# Patient Record
Sex: Female | Born: 1965
Health system: Southern US, Community
[De-identification: ages and names within clinical notes are randomized; demographics above are authoritative.]

## PROBLEM LIST (undated history)

## (undated) DIAGNOSIS — T7840XA Allergy, unspecified, initial encounter: Secondary | ICD-10-CM

## (undated) DIAGNOSIS — A770 Spotted fever due to Rickettsia rickettsii: Secondary | ICD-10-CM

## (undated) DIAGNOSIS — I10 Essential (primary) hypertension: Secondary | ICD-10-CM

## (undated) DIAGNOSIS — Z8619 Personal history of other infectious and parasitic diseases: Secondary | ICD-10-CM

## (undated) DIAGNOSIS — Z8669 Personal history of other diseases of the nervous system and sense organs: Secondary | ICD-10-CM

## (undated) DIAGNOSIS — IMO0002 Reserved for concepts with insufficient information to code with codable children: Secondary | ICD-10-CM

## (undated) DIAGNOSIS — G43909 Migraine, unspecified, not intractable, without status migrainosus: Secondary | ICD-10-CM

## (undated) DIAGNOSIS — E119 Type 2 diabetes mellitus without complications: Secondary | ICD-10-CM

## (undated) DIAGNOSIS — K219 Gastro-esophageal reflux disease without esophagitis: Secondary | ICD-10-CM

## (undated) HISTORY — DX: Migraine, unspecified, not intractable, without status migrainosus: G43.909

## (undated) HISTORY — DX: Personal history of other diseases of the nervous system and sense organs: Z86.69

## (undated) HISTORY — DX: Gastro-esophageal reflux disease without esophagitis: K21.9

## (undated) HISTORY — DX: Personal history of other infectious and parasitic diseases: Z86.19

## (undated) HISTORY — DX: Reserved for concepts with insufficient information to code with codable children: IMO0002

## (undated) HISTORY — DX: Spotted fever due to Rickettsia rickettsii: A77.0

## (undated) HISTORY — DX: Allergy, unspecified, initial encounter: T78.40XA

## (undated) HISTORY — DX: Essential (primary) hypertension: I10

---
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
## 2002-11-01 DIAGNOSIS — A770 Spotted fever due to Rickettsia rickettsii: Secondary | ICD-10-CM

## 2002-11-01 HISTORY — DX: Spotted fever due to Rickettsia rickettsii: A77.0

## 2004-09-10 ENCOUNTER — Other Ambulatory Visit: Payer: Self-pay

## 2004-09-10 ENCOUNTER — Emergency Department: Payer: Self-pay | Admitting: Emergency Medicine

## 2005-12-21 ENCOUNTER — Ambulatory Visit: Payer: Self-pay | Admitting: Family Medicine

## 2006-01-31 ENCOUNTER — Ambulatory Visit: Payer: Self-pay | Admitting: Gastroenterology

## 2008-11-01 HISTORY — PX: OTHER SURGICAL HISTORY: SHX169

## 2011-05-07 LAB — HM PAP SMEAR: HM Pap smear: NORMAL

## 2014-04-19 ENCOUNTER — Ambulatory Visit (INDEPENDENT_AMBULATORY_CARE_PROVIDER_SITE_OTHER): Payer: BC Managed Care – PPO | Admitting: Adult Health

## 2014-04-19 ENCOUNTER — Encounter: Payer: Self-pay | Admitting: Adult Health

## 2014-04-19 ENCOUNTER — Encounter (INDEPENDENT_AMBULATORY_CARE_PROVIDER_SITE_OTHER): Payer: Self-pay

## 2014-04-19 VITALS — BP 151/97 | HR 83 | Temp 98.6°F | Resp 14 | Ht 63.75 in | Wt 224.5 lb

## 2014-04-19 DIAGNOSIS — Z1239 Encounter for other screening for malignant neoplasm of breast: Secondary | ICD-10-CM

## 2014-04-19 DIAGNOSIS — K219 Gastro-esophageal reflux disease without esophagitis: Secondary | ICD-10-CM

## 2014-04-19 DIAGNOSIS — Z23 Encounter for immunization: Secondary | ICD-10-CM

## 2014-04-19 DIAGNOSIS — G43909 Migraine, unspecified, not intractable, without status migrainosus: Secondary | ICD-10-CM | POA: Insufficient documentation

## 2014-04-19 DIAGNOSIS — I10 Essential (primary) hypertension: Secondary | ICD-10-CM | POA: Insufficient documentation

## 2014-04-19 MED ORDER — LISINOPRIL 10 MG PO TABS: 10.0000 mg | ORAL_TABLET | Freq: Every day | ORAL | Status: DC

## 2014-04-19 MED ORDER — SUMATRIPTAN SUCCINATE 50 MG PO TABS: ORAL_TABLET | ORAL | Status: DC

## 2014-04-19 NOTE — Progress Notes (Signed)
Subjective:    Patient ID: Kathryn Henderson, female    DOB: 02/02/1966, 48 y.o.   MRN: [id number]  HPI Pleasant 48 yo caucasian female presents today to establish care. [facility], but had a change in insurance. Last physical and labs were 3 years ago. Has migraine headaches 3-4 times per year. Has been on preventative medication in the past. Thinks it was topamax but not certain. Does not feel she needs prevention since only having them 3-4 times yearly. GERD is well controlled with omeprazole. She has had EGD in the past with findings of ulcer; however, this has resolved.  Health Maintenance:  Last mammogram - 3 years ago  PAP - 3 years ago - reports normal  Vaccinations:  Tetanus - indicates within the last 10 years, but does not recall date.     Past Medical History  Diagnosis Date  . GERD (gastroesophageal reflux disease)   . Ulcer   . Allergy   . History of chicken pox   . Migraine   . [facility]([place] spotted fever) 2004     Past Surgical History  Procedure Laterality Date  . Cyst removal hand Left 04/1977  . Cryo ablasion  2010     Family History  Problem Relation Age of Onset  . Arthritis Mother   . Hypertension Mother   . Arthritis Father   . Cancer Father     lung  . Hyperlipidemia Father   . Heart disease Father   . Hypertension Father   . Hypertension Maternal Grandmother   . Diabetes Maternal Grandmother   . Cancer Paternal Grandmother     breast  . Diabetes Paternal Grandmother    History   Social History  . Marital Status: Married    Spouse Name: N/A    Number of Children: 2  . Years of Education: 16   Occupational History  . Substitute Teacher    Social History Main Topics  . Smoking status: Never Smoker   . Smokeless tobacco: Not on file  . Alcohol Use: Yes     Comment: occasional  . Drug Use: No  . Sexual Activity: Not on file   Other Topics Concern  . Not on file   Social History  Narrative   Hobbies include any outdoor activity.      Review of Systems  Constitutional: Negative.   HENT: Negative.   Eyes: Negative.   Respiratory: Negative.   Cardiovascular: Negative.   Gastrointestinal: Negative.   Endocrine: Negative.   Genitourinary: Negative.   Musculoskeletal: Negative.   Skin: Negative.   Allergic/Immunologic: Negative.   Neurological: Negative.   Hematological: Negative.   Psychiatric/Behavioral: Negative.        Objective:  BP 151/97  Pulse 83  Temp(Src) 98.6 F (37 C) (Oral)  Resp 14  Ht 5' 3.75" (1.619 m)  Wt 224 lb 8 oz (101.833 kg)  BMI 38.85 kg/m2  SpO2 97%  LMP 03/05/2014   Physical Exam  Nursing note and vitals reviewed. Constitutional: She is oriented to person, place, and time. No distress.  HENT:  Head: Normocephalic.  Eyes: Conjunctivae and EOM are normal. Pupils are equal, round, and reactive to light.  Neck: Normal range of motion. Neck supple.  Cardiovascular: Normal rate, regular rhythm, normal heart sounds and intact distal pulses.   Blood pressure significantly elevated in clinic today  Pulmonary/Chest: Effort normal and breath sounds normal.  Abdominal: Soft. Bowel sounds are normal.  Musculoskeletal: Normal range of  motion.  Neurological: She is alert and oriented to person, place, and time. She has normal reflexes.  Skin: Skin is warm and dry.  Psychiatric: She has a normal mood and affect. Her behavior is normal. Judgment and thought content normal.      Assessment & Plan:   1. Essential hypertension Start lisinopril 10 mg daily. Watch dietary sodium. Monitor b/p daily and return in 2 weeks for follow up and labs  2. Gastroesophageal reflux disease without esophagitis Controlled well with omeprazole  3. Migraine, unspecified, without mention of intractable migraine without mention of status migrainosus Start imitrex 50 mg at the onset of migraine. Discussed administration of medication as well as SE.  Follow  4. Screening for breast cancer Ordered mammogram - MM DIGITAL SCREENING BILATERAL; Future  5. Need for Tdap vaccination Administered Tdap in clinic today - Tdap vaccine greater than or equal to 7yo IM

## 2014-04-19 NOTE — Progress Notes (Signed)
Pre visit review using our clinic review tool, if applicable. No additional management support is needed unless otherwise documented below in the visit note. 

## 2014-04-19 NOTE — Patient Instructions (Addendum)
   Please see Kathryn JonesCarolyn prior to leaving the office to schedule you Mammogram  Schedule your complete physical at your earliest convenience. You will need to be fasting. Water is ok.  I sent in a prescription to your pharmacy for imitrex. Take this at the onset of a migraine headache and may repeat in 2 hours if not resolved.  STart lisinopril 10 mg daily. Monitor your blood pressure daily for 2 weeks and bring the readings with you to your next visit in 2 week.

## 2014-04-20 ENCOUNTER — Telehealth: Payer: Self-pay | Admitting: Adult Health

## 2014-04-20 NOTE — Telephone Encounter (Signed)
Relevant patient education assigned to patient using Emmi. ° °

## 2014-04-25 ENCOUNTER — Telehealth: Payer: Self-pay | Admitting: Adult Health

## 2014-04-25 NOTE — Telephone Encounter (Signed)
Pt left vm checking status of mammogram.  States she thought she was seen Friday, 6/19, and thought she would receive a call the same day.

## 2014-05-02 ENCOUNTER — Encounter: Payer: Self-pay | Admitting: Adult Health

## 2014-05-02 ENCOUNTER — Ambulatory Visit (INDEPENDENT_AMBULATORY_CARE_PROVIDER_SITE_OTHER): Payer: BC Managed Care – PPO | Admitting: Adult Health

## 2014-05-02 VITALS — BP 148/82 | HR 86 | Temp 98.2°F | Resp 14 | Wt 224.5 lb

## 2014-05-02 DIAGNOSIS — I10 Essential (primary) hypertension: Secondary | ICD-10-CM

## 2014-05-02 LAB — BASIC METABOLIC PANEL
BUN: 13 mg/dL (ref 6–23)
CO2: 28 mEq/L (ref 19–32)
Calcium: 9.5 mg/dL (ref 8.4–10.5)
Chloride: 102 mEq/L (ref 96–112)
Creatinine, Ser: 0.6 mg/dL (ref 0.4–1.2)
GFR: 120.42 mL/min (ref >60.00)
Glucose, Bld: 208 mg/dL — ABNORMAL HIGH (ref 70–99)
Potassium: 4.6 mEq/L (ref 3.5–5.1)
Sodium: 136 mEq/L (ref 135–145)

## 2014-05-02 MED ORDER — HYDROCHLOROTHIAZIDE 25 MG PO TABS: 25.0000 mg | ORAL_TABLET | Freq: Every day | ORAL | Status: DC

## 2014-05-02 NOTE — Progress Notes (Signed)
Patient ID: Kathryn Henderson, female   DOB: [birth year], 48 y.o.   MRN: [id number]   Subjective:    Patient ID: Kathryn Henderson, female    DOB: [birth year], 48 y.o.   MRN: [id number]  HPI  Pt presents for f/u hypertension. She was recently started on lisinopril 10 mg. Her blood pressure readings at home have been as follows:  6/19 - 177/94   6/24 - 148/91 6/20 - 156/94   6/25 - 170/89 6/21 - 148/91   6/26 - 148/96 6/22 - 150/95   6/27 - 134/90 6/23 - 163/92   6/29 - 148/79  Pt is overall feeling well. No chest pain, shortness of breath. She is voiding without any incident.   Past Medical History  Diagnosis Date  . GERD (gastroesophageal reflux disease)   . Ulcer   . Allergy   . History of chicken pox   . Migraine   . [facility]([place] spotted fever) 2004    Current Outpatient Prescriptions on File Prior to Visit  Medication Sig Dispense Refill  . aspirin-acetaminophen-caffeine (EXCEDRIN MIGRAINE) 250-250-65 MG per tablet Take by mouth every 6 (six) hours as needed for headache.      . cetirizine (ZYRTEC) 10 MG tablet Take 10 mg by mouth daily.      . diphenhydrAMINE (BENADRYL) 25 MG tablet Take 25 mg by mouth every 6 (six) hours as needed.      . fluticasone (FLONASE) 50 MCG/ACT nasal spray Place 1 spray into both nostrils daily.      Marland Kitchen. lisinopril (PRINIVIL,ZESTRIL) 10 MG tablet Take 1 tablet (10 mg total) by mouth daily.  30 tablet  3  . loperamide (IMODIUM A-D) 2 MG tablet Take 2 mg by mouth 4 (four) times daily as needed for diarrhea or loose stools.      . Multiple Vitamin (MULTIVITAMIN) tablet Take 1 tablet by mouth daily.      . naproxen sodium (ANAPROX) 220 MG tablet Take 220 mg by mouth as needed.      Marland Kitchen. omeprazole (PRILOSEC) 20 MG capsule Take 20 mg by mouth daily.      . SUMAtriptan (IMITREX) 50 MG tablet May repeat in 2 hours if headache persists or recurs.  10 tablet  0   No current facility-administered medications on file prior to visit.    Review of Systems    Constitutional: Negative.   HENT: Negative.   Eyes: Negative.   Respiratory: Negative.   Cardiovascular: Negative.   Gastrointestinal: Negative.   Endocrine: Negative.   Genitourinary: Negative.   Musculoskeletal: Negative.   Skin: Negative.   Allergic/Immunologic: Negative.   Neurological: Negative.   Hematological: Negative.   Psychiatric/Behavioral: Negative.        Objective:  BP 148/82  Pulse 86  Temp(Src) 98.2 F (36.8 C) (Oral)  Resp 14  Wt 224 lb 8 oz (101.833 kg)  SpO2 97%  LMP 03/05/2014   Physical Exam  Constitutional: She is oriented to person, place, and time. No distress.  HENT:  Head: Normocephalic and atraumatic.  Eyes: Conjunctivae and EOM are normal.  Neck: Normal range of motion. Neck supple.  Cardiovascular: Normal rate, regular rhythm, normal heart sounds and intact distal pulses.  Exam reveals no gallop and no friction rub.   No murmur heard. Pulmonary/Chest: Effort normal and breath sounds normal. No respiratory distress. She has no wheezes. She has no rales.  Musculoskeletal: Normal range of motion.  Neurological: She is alert and oriented to person, place, and time. She has  normal reflexes. Coordination normal.  Skin: Skin is warm and dry.  Psychiatric: She has a normal mood and affect. Her behavior is normal. Judgment and thought content normal.      Assessment & Plan:   1. Essential hypertension Blood pressure not well controlled on lisinopril 10 mg. Add HCTZ 25 mg. Follow up in 1 month. Pt instructed to bring in her blood pressure machine and her readings. Check bmet today. - Basic metabolic panel

## 2014-05-02 NOTE — Progress Notes (Signed)
Pre visit review using our clinic review tool, if applicable. No additional management support is needed unless otherwise documented below in the visit note. 

## 2014-05-02 NOTE — Patient Instructions (Signed)
  Start HCTZ 25 mg in the morning. Continue the lisinopril as well.  I want you to check your blood pressure as you did previously and return in 1 month for follow up.  Please bring your blood pressure machine with you on your next visit so that we can compare.

## 2014-05-04 ENCOUNTER — Other Ambulatory Visit: Payer: Self-pay | Admitting: Adult Health

## 2014-05-04 DIAGNOSIS — R7309 Other abnormal glucose: Secondary | ICD-10-CM

## 2014-05-06 ENCOUNTER — Ambulatory Visit (INDEPENDENT_AMBULATORY_CARE_PROVIDER_SITE_OTHER): Payer: BC Managed Care – PPO | Admitting: Adult Health

## 2014-05-06 ENCOUNTER — Encounter: Payer: Self-pay | Admitting: Adult Health

## 2014-05-06 VITALS — BP 114/78 | HR 101 | Temp 98.5°F | Resp 14 | Ht 63.75 in | Wt 221.8 lb

## 2014-05-06 DIAGNOSIS — Z0000 Encounter for general adult medical examination without abnormal findings: Secondary | ICD-10-CM

## 2014-05-06 DIAGNOSIS — R739 Hyperglycemia, unspecified: Secondary | ICD-10-CM

## 2014-05-06 DIAGNOSIS — R7309 Other abnormal glucose: Secondary | ICD-10-CM

## 2014-05-06 DIAGNOSIS — Z1239 Encounter for other screening for malignant neoplasm of breast: Secondary | ICD-10-CM

## 2014-05-06 DIAGNOSIS — E785 Hyperlipidemia, unspecified: Secondary | ICD-10-CM

## 2014-05-06 NOTE — Patient Instructions (Signed)
  You had your annual physical today.  Mammogram scheduled early August.  PAP will be due 2017 or sooner if necessary.  Please return for fasting labs. You can schedule a lab appointment prior to leaving the office today.  We will notify you of the results once they are available and I have had a chance to review them.  Please feel free to call with any questions or concerns.

## 2014-05-06 NOTE — Progress Notes (Signed)
Pre visit review using our clinic review tool, if applicable. No additional management support is needed unless otherwise documented below in the visit note. 

## 2014-05-06 NOTE — Progress Notes (Signed)
Patient ID: Kathryn SaxonSuzanne Henderson, female   DOB: [birth year], 48 y.o.   MRN: [id number]   Subjective:    Patient ID: Kathryn SaxonSuzanne Henderson, female    DOB: [birth year], 48 y.o.   MRN: [id number]  HPI Pt is a pleasant 48 y/o female who presents to clinic for her annual physical exam. She is not fasting. She will return to have fasting labs as part of this routine general exam. Overall, she is feeling well.  Health Maintenance:  PAP - 2012. Next due 2017 Mammogram - appt scheduled in early August   Past Medical History  Diagnosis Date  . GERD (gastroesophageal reflux disease)   . Ulcer   . Allergy   . History of chicken pox   . Migraine   . [facility]([place] fever) 2004     Past Surgical History  Procedure Laterality Date  . Cyst removal hand Left 04/1977  . Cryo ablasion  2010     Family History  Problem Relation Age of Onset  . Arthritis Mother   . Hypertension Mother   . Arthritis Father   . Cancer Father     lung  . Hyperlipidemia Father   . Heart disease Father   . Hypertension Father   . Hypertension Maternal Grandmother   . Diabetes Maternal Grandmother   . Cancer Paternal Grandmother     breast  . Diabetes Paternal Grandmother      History   Social History  . Marital Status: Married    Spouse Name: N/A    Number of Children: 2  . Years of Education: 16   Occupational History  . Substitute Teacher    Social History Main Topics  . Smoking status: Never Smoker   . Smokeless tobacco: Not on file  . Alcohol Use: Yes     Comment: occasional  . Drug Use: No  . Sexual Activity: Not on file   Other Topics Concern  . Not on file   Social History Narrative   Hobbies include any outdoor activity.      Current Outpatient Prescriptions on File Prior to Visit  Medication Sig Dispense Refill  . aspirin-acetaminophen-caffeine (EXCEDRIN MIGRAINE) 250-250-65 MG per tablet Take by mouth every 6 (six) hours as needed for headache.      . cetirizine (ZYRTEC) 10  MG tablet Take 10 mg by mouth daily.      . diphenhydrAMINE (BENADRYL) 25 MG tablet Take 25 mg by mouth every 6 (six) hours as needed.      . fluticasone (FLONASE) 50 MCG/ACT nasal spray Place 1 spray into both nostrils daily.      . hydrochlorothiazide (HYDRODIURIL) 25 MG tablet Take 1 tablet (25 mg total) by mouth daily.  30 tablet  3  . lisinopril (PRINIVIL,ZESTRIL) 10 MG tablet Take 1 tablet (10 mg total) by mouth daily.  30 tablet  3  . loperamide (IMODIUM A-D) 2 MG tablet Take 2 mg by mouth 4 (four) times daily as needed for diarrhea or loose stools.      . Multiple Vitamin (MULTIVITAMIN) tablet Take 1 tablet by mouth daily.      . naproxen sodium (ANAPROX) 220 MG tablet Take 220 mg by mouth as needed.      Marland Kitchen. omeprazole (PRILOSEC) 20 MG capsule Take 20 mg by mouth daily.      . SUMAtriptan (IMITREX) 50 MG tablet May repeat in 2 hours if headache persists or recurs.  10 tablet  0   No current facility-administered medications on  file prior to visit.     Review of Systems  Constitutional: Negative.   HENT: Negative.   Eyes: Negative.   Respiratory: Negative.   Cardiovascular: Negative.   Gastrointestinal: Negative.   Endocrine: Negative.   Genitourinary: Negative.   Musculoskeletal: Negative.   Skin: Negative.   Allergic/Immunologic: Negative.   Neurological: Negative.   Hematological: Negative.   Psychiatric/Behavioral: Negative.        Objective:  BP 114/78  Pulse 101  Temp(Src) 98.5 F (36.9 C) (Oral)  Resp 14  Ht 5' 3.75" (1.619 m)  Wt 221 lb 12 oz (100.585 kg)  BMI 38.37 kg/m2  SpO2 96%  LMP 03/05/2014   Physical Exam  Constitutional: She is oriented to person, place, and time. No distress.  Overweight, pleasant 48 y/o female  HENT:  Head: Normocephalic and atraumatic.  Left Ear: External ear normal.  Nose: Nose normal.  Mouth/Throat: Oropharynx is clear and moist.  Right ear with cerumen buildup  Eyes: Conjunctivae and EOM are normal. Pupils are equal,  round, and reactive to light.  Neck: Normal range of motion. Neck supple. No tracheal deviation present. No thyromegaly present.  Cardiovascular: Normal rate, regular rhythm, normal heart sounds and intact distal pulses.  Exam reveals no gallop and no friction rub.   No murmur heard. Pulmonary/Chest: Effort normal and breath sounds normal. No respiratory distress. She has no wheezes. She has no rales.  Abdominal: Soft. Bowel sounds are normal. She exhibits no distension and no mass. There is no tenderness. There is no rebound and no guarding.  Musculoskeletal: Normal range of motion. She exhibits no edema and no tenderness.  Lymphadenopathy:    She has no cervical adenopathy.  Neurological: She is alert and oriented to person, place, and time. She has normal reflexes. No cranial nerve deficit. Coordination normal.  Skin: Skin is warm and dry.  Psychiatric: She has a normal mood and affect. Her behavior is normal. Judgment and thought content normal.       Assessment & Plan:   1. Routine general medical examination at a health care facility Normal physical exam including breast exam. PAP deferred and due in 2017. Labs will be ordered. Pt will return for fasting labs.  2. Screening for breast cancer She has appointment for mammogram in early August.   3. Blood glucose elevated Recent blood glucose elevated. Check A1c. - Hemoglobin A1c; Future  4. HLD (hyperlipidemia) Screening for HLD. Check lipids. Follow. - Lipid panel; Future

## 2014-05-07 ENCOUNTER — Other Ambulatory Visit: Payer: BC Managed Care – PPO

## 2014-05-14 ENCOUNTER — Other Ambulatory Visit (INDEPENDENT_AMBULATORY_CARE_PROVIDER_SITE_OTHER): Payer: BC Managed Care – PPO

## 2014-05-14 DIAGNOSIS — E785 Hyperlipidemia, unspecified: Secondary | ICD-10-CM

## 2014-05-14 DIAGNOSIS — R739 Hyperglycemia, unspecified: Secondary | ICD-10-CM

## 2014-05-14 DIAGNOSIS — R7309 Other abnormal glucose: Secondary | ICD-10-CM

## 2014-05-14 LAB — LIPID PANEL
Cholesterol: 187 mg/dL (ref 0–200)
HDL: 39.4 mg/dL (ref >39.00)
LDL Cholesterol: 117 mg/dL — ABNORMAL HIGH (ref 0–99)
NonHDL: 147.6
Total CHOL/HDL Ratio: 5
Triglycerides: 154 mg/dL — ABNORMAL HIGH (ref 0.0–149.0)
VLDL: 30.8 mg/dL (ref 0.0–40.0)

## 2014-05-14 LAB — HEMOGLOBIN A1C: Hgb A1c MFr Bld: 7.4 % — ABNORMAL HIGH (ref 4.6–6.5)

## 2014-05-15 ENCOUNTER — Encounter: Payer: Self-pay | Admitting: *Deleted

## 2014-05-15 LAB — GLUCOSE, FASTING: Glucose, Fasting: 156 mg/dL — ABNORMAL HIGH (ref 70–99)

## 2014-05-18 ENCOUNTER — Other Ambulatory Visit: Payer: Self-pay | Admitting: Adult Health

## 2014-05-18 MED ORDER — METFORMIN HCL 500 MG PO TABS: 500.0000 mg | ORAL_TABLET | Freq: Two times a day (BID) | ORAL | Status: DC

## 2014-05-22 ENCOUNTER — Telehealth: Payer: Self-pay

## 2014-05-22 NOTE — Telephone Encounter (Signed)
2nd attempt to reach patient regarding her recent mammogram results. No answer, left a voicemail asking patient to call the office. Left my callback number on the voicemail as well.

## 2014-05-23 ENCOUNTER — Telehealth: Payer: Self-pay

## 2014-05-23 NOTE — Telephone Encounter (Signed)
Per Raquel: Called patient to confirm she had a follow up appointment scheduled for addition films to done for mammogram. Patient stated she has appt today at 2:30pm.

## 2014-05-30 ENCOUNTER — Ambulatory Visit (INDEPENDENT_AMBULATORY_CARE_PROVIDER_SITE_OTHER): Payer: BC Managed Care – PPO | Admitting: Adult Health

## 2014-05-30 ENCOUNTER — Encounter: Payer: Self-pay | Admitting: Adult Health

## 2014-05-30 VITALS — BP 121/83 | HR 93 | Temp 98.6°F | Resp 14 | Wt 219.8 lb

## 2014-05-30 DIAGNOSIS — I10 Essential (primary) hypertension: Secondary | ICD-10-CM

## 2014-05-30 LAB — BASIC METABOLIC PANEL
BUN: 13 mg/dL (ref 6–23)
CO2: 29 mEq/L (ref 19–32)
Calcium: 9.7 mg/dL (ref 8.4–10.5)
Chloride: 101 mEq/L (ref 96–112)
Creatinine, Ser: 0.6 mg/dL (ref 0.4–1.2)
GFR: 109.25 mL/min (ref >60.00)
Glucose, Bld: 148 mg/dL — ABNORMAL HIGH (ref 70–99)
Potassium: 3.8 mEq/L (ref 3.5–5.1)
Sodium: 138 mEq/L (ref 135–145)

## 2014-05-30 NOTE — Progress Notes (Signed)
Pre visit review using our clinic review tool, if applicable. No additional management support is needed unless otherwise documented below in the visit note. 

## 2014-05-30 NOTE — Progress Notes (Signed)
Patient ID: Kathryn Henderson, female   DOB: February 04, 1966, 47 y.o.   MRN: [id number]   Subjective:    Patient ID: Kathryn Henderson, female    DOB: 1966/04/18, 48 y.o.   MRN: [id number]  HPI  Kathryn Henderson is a pleasant 48 y/o female who presents to clinic for HTN f/u. She is taking lisinopril and HCTZ. Doing well with both medications. She has been checking her blood pressure and brings several readings with her:  130/81  113/79 134/77  124/77 135/76  128/79 129/70  116/77    Past Medical History  Diagnosis Date  . GERD (gastroesophageal reflux disease)   . Ulcer   . Allergy   . History of chicken pox   . Migraine   . [facility] spotted fever) 2004    Current Outpatient Prescriptions on File Prior to Visit  Medication Sig Dispense Refill  . aspirin-acetaminophen-caffeine (EXCEDRIN MIGRAINE) 250-250-65 MG per tablet Take by mouth every 6 (six) hours as needed for headache.      . cetirizine (ZYRTEC) 10 MG tablet Take 10 mg by mouth daily.      . diphenhydrAMINE (BENADRYL) 25 MG tablet Take 25 mg by mouth every 6 (six) hours as needed.      . fluticasone (FLONASE) 50 MCG/ACT nasal spray Place 1 spray into both nostrils daily.      . hydrochlorothiazide (HYDRODIURIL) 25 MG tablet Take 1 tablet (25 mg total) by mouth daily.  30 tablet  3  . lisinopril (PRINIVIL,ZESTRIL) 10 MG tablet Take 1 tablet (10 mg total) by mouth daily.  30 tablet  3  . loperamide (IMODIUM A-D) 2 MG tablet Take 2 mg by mouth 4 (four) times daily as needed for diarrhea or loose stools.      . metFORMIN (GLUCOPHAGE) 500 MG tablet Take 1 tablet (500 mg total) by mouth 2 (two) times daily with a meal.  60 tablet  3  . Multiple Vitamin (MULTIVITAMIN) tablet Take 1 tablet by mouth daily.      . naproxen sodium (ANAPROX) 220 MG tablet Take 220 mg by mouth as needed.      Marland Kitchen omeprazole (PRILOSEC) 20 MG capsule Take 20 mg by mouth daily.      . SUMAtriptan (IMITREX) 50 MG tablet May repeat in 2 hours if headache persists or  recurs.  10 tablet  0   No current facility-administered medications on file prior to visit.     Review of Systems  Constitutional: Negative.   HENT: Negative.   Eyes: Negative.   Respiratory: Negative.  Negative for cough.   Cardiovascular: Negative.  Negative for chest pain, palpitations and leg swelling.  Gastrointestinal: Negative.   Endocrine: Negative.   Genitourinary: Negative.   Musculoskeletal: Negative.   Skin: Negative.   Allergic/Immunologic: Negative.   Neurological: Negative.   Hematological: Negative.   Psychiatric/Behavioral: Negative.        Objective:  There were no vitals taken for this visit.   Physical Exam  Constitutional: She is oriented to person, place, and time. No distress.  HENT:  Head: Normocephalic and atraumatic.  Eyes: Conjunctivae and EOM are normal.  Neck: Normal range of motion. Neck supple.  Cardiovascular: Normal rate, regular rhythm, normal heart sounds and intact distal pulses.  Exam reveals no gallop and no friction rub.   No murmur heard. Pulmonary/Chest: Effort normal and breath sounds normal. No respiratory distress. She has no wheezes. She has no rales.  Musculoskeletal: Normal range of motion.  Neurological: She is  alert and oriented to person, place, and time. She has normal reflexes. Coordination normal.  Skin: Skin is warm and dry.  Psychiatric: She has a normal mood and affect. Her behavior is normal. Judgment and thought content normal.      Assessment & Plan:   1. Essential hypertension Well controlled on current meds. Check labs. Follow. - Basic metabolic panel

## 2014-05-31 ENCOUNTER — Encounter: Payer: Self-pay | Admitting: *Deleted

## 2014-06-07 ENCOUNTER — Encounter: Payer: Self-pay | Admitting: Adult Health

## 2014-06-10 DIAGNOSIS — R928 Other abnormal and inconclusive findings on diagnostic imaging of breast: Secondary | ICD-10-CM | POA: Insufficient documentation

## 2014-06-10 HISTORY — PX: BREAST BIOPSY: SHX20

## 2014-06-25 ENCOUNTER — Encounter: Payer: Self-pay | Admitting: Adult Health

## 2014-07-15 ENCOUNTER — Other Ambulatory Visit: Payer: Self-pay | Admitting: Adult Health

## 2014-07-29 ENCOUNTER — Other Ambulatory Visit: Payer: Self-pay | Admitting: *Deleted

## 2014-07-29 MED ORDER — HYDROCHLOROTHIAZIDE 25 MG PO TABS: 25.0000 mg | ORAL_TABLET | Freq: Every day | ORAL | Status: DC

## 2014-08-30 ENCOUNTER — Ambulatory Visit: Payer: BC Managed Care – PPO | Admitting: Internal Medicine

## 2014-09-09 ENCOUNTER — Telehealth: Payer: Self-pay

## 2014-09-09 MED ORDER — METFORMIN HCL 500 MG PO TABS: 500.0000 mg | ORAL_TABLET | Freq: Two times a day (BID) | ORAL | Status: DC

## 2014-09-09 NOTE — Telephone Encounter (Signed)
Paper Rx request.

## 2014-09-24 ENCOUNTER — Encounter: Payer: Self-pay | Admitting: Internal Medicine

## 2014-09-24 ENCOUNTER — Ambulatory Visit (INDEPENDENT_AMBULATORY_CARE_PROVIDER_SITE_OTHER): Payer: BC Managed Care – PPO | Admitting: Internal Medicine

## 2014-09-24 ENCOUNTER — Encounter (INDEPENDENT_AMBULATORY_CARE_PROVIDER_SITE_OTHER): Payer: Self-pay

## 2014-09-24 VITALS — BP 126/78 | HR 83 | Temp 98.5°F | Resp 16 | Ht 63.75 in | Wt 219.8 lb

## 2014-09-24 DIAGNOSIS — I10 Essential (primary) hypertension: Secondary | ICD-10-CM

## 2014-09-24 DIAGNOSIS — Z23 Encounter for immunization: Secondary | ICD-10-CM

## 2014-09-24 DIAGNOSIS — E119 Type 2 diabetes mellitus without complications: Secondary | ICD-10-CM

## 2014-09-24 NOTE — Progress Notes (Signed)
Patient ID: Kathryn BridgemanSuzanne J Henderson, female   DOB: [birth year], 48 y.o.   MRN: [id number]  Patient Active Problem List   Diagnosis Date Noted  . Type II diabetes mellitus 09/26/2014  . Screening for breast cancer 04/19/2014  . Need for Tdap vaccination 04/19/2014  . Essential hypertension 04/19/2014  . Migraine, unspecified, without mention of intractable migraine without mention of status migrainosus 04/19/2014  . Gastroesophageal reflux disease without esophagitis 04/19/2014    Subjective:  CC:   Chief Complaint  Patient presents with  . Follow-up    blood sugar diagnosed with hyperglycemia and wonders if she is diabetic    HPI:   Kathryn BridgemanSuzanne J Henderson is a 48 y.o. female who presents for   3 month follow up  on hyperglycemia , treated with metformin which was started in July by RR for a1c 7.4 and fasting glucose 158.  Patient was not aware that she was diagnosed with DM at that time.and has not been checking her blood sugars, but has been taking the metformin and reducing the refined sugar in her diet.  She has a history of gestational diabetes ,  Which occurred over 15 years ago  She denies any changes in vision and denies having polyuria, polydipsia and parasthesias.       Past Medical History  Diagnosis Date  . GERD (gastroesophageal reflux disease)   . Ulcer   . Allergy   . History of chicken pox   . Migraine   . RMSF [facility]([place] spotted fever) 2004    Past Surgical History  Procedure Laterality Date  . Cyst removal hand Left 04/1977  . Cryo ablasion  2010       The following portions of the patient's history were reviewed and updated as appropriate: Allergies, current medications, and problem list.    Review of Systems:   Patient denies headache, fevers, malaise, unintentional weight loss, skin rash, eye pain, sinus congestion and sinus pain, sore throat, dysphagia,  hemoptysis , cough, dyspnea, wheezing, chest pain, palpitations, orthopnea, edema, abdominal pain,  nausea, melena, diarrhea, constipation, flank pain, dysuria, hematuria, urinary  Frequency, nocturia, numbness, tingling, seizures,  Focal weakness, Loss of consciousness,  Tremor, insomnia, depression, anxiety, and suicidal ideation.     History   Social History  . Marital Status: Married    Spouse Name: N/A    Number of Children: 2  . Years of Education: 16   Occupational History  . Substitute Teacher    Social History Main Topics  . Smoking status: Never Smoker   . Smokeless tobacco: Not on file  . Alcohol Use: Yes     Comment: occasional  . Drug Use: No  . Sexual Activity: Not on file   Other Topics Concern  . Not on file   Social History Narrative   Hobbies include any outdoor activity.     Objective:  Filed Vitals:   09/24/14 1413  BP: 126/78  Pulse: 83  Temp: 98.5 F (36.9 C)  Resp: 16     General appearance: alert, cooperative and appears stated age Ears: normal TM's and external ear canals both ears Throat: lips, mucosa, and tongue normal; teeth and gums normal Neck: no adenopathy, no carotid bruit, supple, symmetrical, trachea midline and thyroid not enlarged, symmetric, no tenderness/mass/nodules Back: symmetric, no curvature. ROM normal. No CVA tenderness. Lungs: clear to auscultation bilaterally Heart: regular rate and rhythm, S1, S2 normal, no murmur, click, rub or gallop Abdomen: soft, non-tender; bowel sounds normal; no masses,  no  organomegaly Pulses: 2+ and symmetric Skin: Skin color, texture, turgor normal. No rashes or lesions Lymph nodes: Cervical, supraclavicular, and axillary nodes normal.  Assessment and Plan:  Type II diabetes mellitus Diagnosed with fasting glucose of 158 and a1c of 7.4 in July.  She has been taking metformin since then  And repeat a1c is  Now < 7.0.  Foot exam is normal.  Education initiated and low glycemic index diet and glucometer given.  [facility].  Diabetic  Eye exam advised.    Lab Results  Component Value Date   HGBA1C 6.8* 09/24/2014   Lab Results  Component Value Date   MICROALBUR 0.7 09/24/2014   Lab Results  Component Value Date   CHOL 187 05/14/2014   HDL 39.40 05/14/2014   LDLCALC 117* 05/14/2014   TRIG 154.0* 05/14/2014   CHOLHDL 5 05/14/2014     Essential hypertension Well controlled on current regimen. Renal function stable, no changes today.  Lab Results  Component Value Date   CREATININE 0.6 09/24/2014   Lab Results  Component Value Date   NA 136 09/24/2014   K 3.8 09/24/2014   CL 99 09/24/2014   CO2 27 09/24/2014      Updated Medication List Outpatient Encounter Prescriptions as of 09/24/2014  Medication Sig  . aspirin-acetaminophen-caffeine (EXCEDRIN MIGRAINE) 250-250-65 MG per tablet Take by mouth every 6 (six) hours as needed for headache.  . cetirizine (ZYRTEC) 10 MG tablet Take 10 mg by mouth daily.  . diphenhydrAMINE (BENADRYL) 25 MG tablet Take 25 mg by mouth every 6 (six) hours as needed.  . fluticasone (FLONASE) 50 MCG/ACT nasal spray Place 1 spray into both nostrils daily.  . hydrochlorothiazide (HYDRODIURIL) 25 MG tablet Take 1 tablet (25 mg total) by mouth daily.  Marland Kitchen. lisinopril (PRINIVIL,ZESTRIL) 10 MG tablet TAKE ONE TABLET BY MOUTH EVERY DAY  . loperamide (IMODIUM A-D) 2 MG tablet Take 2 mg by mouth 4 (four) times daily as needed for diarrhea or loose stools.  . metFORMIN (GLUCOPHAGE) 500 MG tablet Take 1 tablet (500 mg total) by mouth 2 (two) times daily with a meal.  . Multiple Vitamin (MULTIVITAMIN) tablet Take 1 tablet by mouth daily.  . naproxen sodium (ANAPROX) 220 MG tablet Take 220 mg by mouth as needed.  Marland Kitchen. omeprazole (PRILOSEC) 20 MG capsule Take 20 mg by mouth daily.  . SUMAtriptan (IMITREX) 50 MG tablet May repeat in 2 hours if headache persists or recurs.     Orders Placed This Encounter  Procedures  . Pneumococcal polysaccharide vaccine 23-valent greater than or equal to 2yo subcutaneous/IM   . Hemoglobin A1c  . Microalbumin / creatinine urine ratio  . Comprehensive metabolic panel    Return in about 3 months (around 12/25/2014).

## 2014-09-24 NOTE — Progress Notes (Signed)
Pre-visit discussion using our clinic review tool. No additional management support is needed unless otherwise documented below in the visit note.  

## 2014-09-24 NOTE — Patient Instructions (Signed)
You have adult onset diabetes.  You need to have an annual  diabetic eye exam to prevent loss of vision from changes that occur to the blood vessels in your retina. We tested your urine today for protein (a sign of kidney issues due to diabetes)and repeated your A1c I would like to repeat your blood tests, including fasting lipids in 3 months after you have followed a Low GI (glycemic index ) diet.  You received the pneumonia vaccine today; please get the influenza vaccine next week   Please start taking a baby aspirin daily. If you are not already  Please check you blood sugars once daily: either fasting, or 2 hours after different meals (post prandial).    Normal /controlled diabetes blood sugars are < 130 fasting and < 150 post prandial  This is  One version of a  "Low GI"  Diet:  It will still lower your blood sugars and allow you to lose 4 to 8  lbs  per month if you follow it carefully.  Your goal with exercise is a minimum of 30 minutes of aerobic exercise 5 days per week (Walking does not count once it becomes easy!)    All of the foods can be found at grocery stores and in bulk at [employer].  The Atkins protein bars and shakes are available in more varieties at Target, WalMart and Lowe's Foods.     7 AM Breakfast:  Choose from the following:  Low carbohydrate Protein  Shakes (I recommend the EAS AdvantEdge "Carb Control" shakes  Or the low carb shakes by Atkins.    2.5 carbs   Arnold's "Sandwhich Thin"toasted  w/ peanut butter (no jelly: about 20 net carbs  "Bagel Thin" with cream cheese and salmon: about 20 carbs   a scrambled egg/bacon/cheese burrito made with Mission's "carb balance" whole wheat tortilla  (about 10 net carbs )   Avoid cereal and bananas, oatmeal and cream of wheat and grits. They are loaded with carbohydrates!   10 AM: high protein snack  Protein bar by Atkins (the snack size, under 200 cal, usually < 6 net carbs).    A stick of cheese:  Around 1 carb,  100 cal      Dannon Light n Fit [place]Greek Yogurt  (80 cal, 8 carbs)  Other so called "protein bars" and Greek yogurts tend to be loaded with carbohydrates.  Remember, in food advertising, the word "energy" is synonymous for " carbohydrate."  Lunch:   A Sandwich using the bread choices listed, Can use any  Eggs,  lunchmeat, grilled meat or canned tuna), avocado, regular mayo/mustard  and cheese.  A Salad using blue cheese, ranch,  Goddess or vinagrette,  No croutons or "confetti" and no "candied nuts" but regular nuts OK.   No pretzels or chips.  Pickles and miniature sweet peppers are a good low carb alternative that provide a "crunch"  The bread is the only source of carbohydrate in a sandwich and  can be decreased by trying some of these alternatives to traditional loaf bread  Joseph's makes a pita bread and a flat bread that are 50 cal and 4 net carbs available at BJs and WalMart.  This can be toasted to use with hummous as well  Toufayan makes a low carb flatbread that's 100 cal and 9 net carbs available at [employer] and [employer]  Mission makes 2 sizes of  Low carb whole wheat tortilla  (The large one is 210 cal  and 6 net carbs) Avoid "Low fat dressings, as well as Reyne DumasCatalina and [address] dressings They are loaded with sugar!   3 PM/ Mid day  Snack:  Consider  1 ounce of  almonds, walnuts, pistachios, pecans, peanuts,  Macadamia nuts or a nut medley.  Avoid "granola"; the dried cranberries and raisins are loaded with carbohydrates. Mixed nuts as long as there are no raisins,  cranberries or dried fruit.    Try the prosciutto/mozzarella cheese sticks by Fiorruci  In deli /backery section   High protein      6 PM  Dinner:     Meat/fowl/fish with a green salad, and either broccoli, cauliflower, green beans, spinach, brussel sprouts or  Lima beans. DO NOT BREAD THE PROTEIN!!      There is a low carb pasta by Dreamfield's that is acceptable and tastes great: only 5 digestible carbs/serving.( All grocery  stores but BJs carry it )  Try Kai LevinsMichel Angelo's chicken piccata or chicken or eggplant parm over low carb pasta.(Lowes and BJs)   Clifton CustardAaron Sanchez's "Carnitas" (pulled pork, no sauce,  0 carbs) or his beef pot roast to make a dinner burrito (at BJ's)  Pesto over low carb pasta (bj's sells a good quality pesto in the center refrigerated section of the deli   Try satueeing  Roosvelt HarpsBok Choy with mushroooms  Whole wheat pasta is still full of digestible carbs and  Not as low in glycemic index as Dreamfield's.   [place] rice is still rice,  So skip the rice and noodles if you eat Congohinese or [place] Zealandhai (or at least limit to 1/2 cup)  9 PM snack :   Breyer's "low carb" fudgsicle or  ice cream bar (Carb Smart line), or  Weight Watcher's ice cream bar , or another "no sugar added" ice cream;  a serving of fresh berries/cherries with whipped cream   Cheese or DANNON'S LlGHT N FIT GREEK YOGURT  8 ounces of [place] unsweetened almond/cococunut milk    Avoid bananas, pineapple, grapes  and watermelon on a regular basis because they are high in sugar.  THINK OF THEM AS DESSERT  Remember that snack Substitutions should be less than 10 NET carbs per serving and meals < 20 carbs. Remember to subtract fiber grams to get the "net carbs."

## 2014-09-25 LAB — COMPREHENSIVE METABOLIC PANEL
ALT: 20 U/L (ref 0–35)
AST: 24 U/L (ref 0–37)
Albumin: 4.4 g/dL (ref 3.5–5.2)
Alkaline Phosphatase: 75 U/L (ref 39–117)
BUN: 12 mg/dL (ref 6–23)
CO2: 27 mEq/L (ref 19–32)
Calcium: 9.4 mg/dL (ref 8.4–10.5)
Chloride: 99 mEq/L (ref 96–112)
Creatinine, Ser: 0.6 mg/dL (ref 0.4–1.2)
GFR: 107.11 mL/min (ref >60.00)
Glucose, Bld: 133 mg/dL — ABNORMAL HIGH (ref 70–99)
Potassium: 3.8 mEq/L (ref 3.5–5.1)
Sodium: 136 mEq/L (ref 135–145)
Total Bilirubin: 0.5 mg/dL (ref 0.2–1.2)
Total Protein: 7.8 g/dL (ref 6.0–8.3)

## 2014-09-25 LAB — MICROALBUMIN / CREATININE URINE RATIO
Creatinine,U: 87.5 mg/dL
Microalb Creat Ratio: 0.8 mg/g (ref 0.0–30.0)
Microalb, Ur: 0.7 mg/dL (ref 0.0–1.9)

## 2014-09-25 LAB — HEMOGLOBIN A1C: Hgb A1c MFr Bld: 6.8 % — ABNORMAL HIGH (ref 4.6–6.5)

## 2014-09-26 DIAGNOSIS — E119 Type 2 diabetes mellitus without complications: Secondary | ICD-10-CM | POA: Insufficient documentation

## 2014-09-26 NOTE — Assessment & Plan Note (Signed)
Well controlled on current regimen. Renal function stable, no changes today.  Lab Results  Component Value Date   CREATININE 0.6 09/24/2014   Lab Results  Component Value Date   NA 136 09/24/2014   K 3.8 09/24/2014   CL 99 09/24/2014   CO2 27 09/24/2014

## 2014-09-26 NOTE — Assessment & Plan Note (Signed)
Diagnosed with fasting glucose of 158 and a1c of 7.4 in July.  She has been taking metformin since then  And repeat a1c is  Now < 7.0.  Foot exam is normal.  Education initiated and low glycemic index diet and glucometer given.  [facility].  Diabetic  Eye exam advised.   Lab Results  Component Value Date   HGBA1C 6.8* 09/24/2014   Lab Results  Component Value Date   MICROALBUR 0.7 09/24/2014   Lab Results  Component Value Date   CHOL 187 05/14/2014   HDL 39.40 05/14/2014   LDLCALC 117* 05/14/2014   TRIG 154.0* 05/14/2014   CHOLHDL 5 05/14/2014

## 2014-09-27 ENCOUNTER — Encounter: Payer: Self-pay | Admitting: Internal Medicine

## 2014-09-29 ENCOUNTER — Other Ambulatory Visit: Payer: Self-pay | Admitting: Internal Medicine

## 2014-09-29 MED ORDER — HYDROCHLOROTHIAZIDE 25 MG PO TABS: 25.0000 mg | ORAL_TABLET | Freq: Every day | ORAL | Status: DC

## 2014-09-29 MED ORDER — LISINOPRIL 10 MG PO TABS: 10.0000 mg | ORAL_TABLET | Freq: Every day | ORAL | Status: DC

## 2014-09-29 MED ORDER — METFORMIN HCL 500 MG PO TABS: 500.0000 mg | ORAL_TABLET | Freq: Two times a day (BID) | ORAL | Status: DC

## 2014-10-03 ENCOUNTER — Telehealth: Payer: Self-pay

## 2014-10-03 ENCOUNTER — Other Ambulatory Visit: Payer: Self-pay | Admitting: Nurse Practitioner

## 2014-10-03 MED ORDER — BENZONATATE 100 MG PO CAPS: 100.0000 mg | ORAL_CAPSULE | Freq: Three times a day (TID) | ORAL | Status: DC | PRN

## 2014-10-03 NOTE — Telephone Encounter (Signed)
The patient called and stated she is having lots of coughing.  She is hoping the "pearls" can be called in for her.   Pharmacy- [facility]

## 2014-10-03 NOTE — Telephone Encounter (Signed)
She does not need an appointment unless there is a fever or other symptoms. I will send in Tessalon Pearles. Please call her and if she is not having other symptoms that the Rx was sent to her pharmacy and call us if this is not helpful for her cough.. If she has other symptoms also schedule her to be seen, please. Thanks.

## 2014-10-03 NOTE — Telephone Encounter (Signed)
Please review previous phone note. Patient is requesting medication for a cough. Last office visit was in Nov with dr. Darrick Huntsmanullo. Does patient need to make an appointment for evaluation or with you send Rx to pharmacy. Please advise

## 2014-10-03 NOTE — Telephone Encounter (Signed)
Called patient no answer. Left call back number for patient

## 2014-10-04 NOTE — Telephone Encounter (Signed)
Spoke to patient today to check on her symptoms. Patient stated that she [facility] and is already feeling better this morning. Patient stated that she is not have any other symptoms at this time. She will call if new symptoms occur.

## 2014-12-25 ENCOUNTER — Ambulatory Visit: Payer: BC Managed Care – PPO | Admitting: Nurse Practitioner

## 2015-01-07 ENCOUNTER — Encounter: Payer: Self-pay | Admitting: Nurse Practitioner

## 2015-01-07 ENCOUNTER — Ambulatory Visit (INDEPENDENT_AMBULATORY_CARE_PROVIDER_SITE_OTHER): Payer: BLUE CROSS/BLUE SHIELD | Admitting: Nurse Practitioner

## 2015-01-07 VITALS — BP 122/72 | HR 81 | Temp 97.1°F | Resp 14 | Ht 63.0 in | Wt 204.0 lb

## 2015-01-07 DIAGNOSIS — G43109 Migraine with aura, not intractable, without status migrainosus: Secondary | ICD-10-CM

## 2015-01-07 DIAGNOSIS — E119 Type 2 diabetes mellitus without complications: Secondary | ICD-10-CM

## 2015-01-07 MED ORDER — RIZATRIPTAN BENZOATE 10 MG PO TABS: 10.0000 mg | ORAL_TABLET | ORAL | Status: DC | PRN

## 2015-01-07 NOTE — Progress Notes (Signed)
Pre visit review using our clinic review tool, if applicable. No additional management support is needed unless otherwise documented below in the visit note. 

## 2015-01-07 NOTE — Progress Notes (Signed)
Subjective:    Patient ID: Kathryn BridgemanSuzanne J Henderson, female    DOB: 1965/11/26, 49 y.o.   MRN: [id number]  HPI  Kathryn Henderson is a 49 yo female here for a 3 month follow up on her diabetes.  1) DM type II-   Last A1c 6.8 09/24/14  Microalbumin same date normal  Foot exam up to date Eye exam- Not up to date  BS both types at home- under 150's fasting mainly in 120's  [facility] class- Not gone as of yet  Metformin 500 twice daily- compliant, no issues  2) Migraines- after 2 of the imitrex as prescribed, decreases intensity and no vomiting, but not able to drive or be able to work. 3 weeks ago last episode. Has them intermittently had since teens, can be allergy related, but no known trigger. Wavy lines or colorful bubbles as auras.    Review of Systems  Constitutional: Negative for fever, chills, diaphoresis and fatigue.  Eyes: Negative for visual disturbance.  Respiratory: Negative for chest tightness, shortness of breath and wheezing.   Cardiovascular: Negative for chest pain, palpitations and leg swelling.  Gastrointestinal: Negative for nausea, vomiting and diarrhea.  Endocrine: Negative for polydipsia, polyphagia and polyuria.  Skin: Negative for rash.  Neurological: Positive for headaches. Negative for dizziness, weakness and numbness.  Psychiatric/Behavioral: The patient is not nervous/anxious.    Past Medical History  Diagnosis Date  . GERD (gastroesophageal reflux disease)   . Ulcer   . Allergy   . History of chicken pox   . Migraine   . RMSF [facility]([place] spotted fever) 2004    History   Social History  . Marital Status: Married    Spouse Name: N/A  . Number of Children: 2  . Years of Education: 16   Occupational History  . Substitute Teacher    Social History Main Topics  . Smoking status: Never Smoker   . Smokeless tobacco: Not on file  . Alcohol Use: Yes     Comment: occasional  . Drug Use: No  . Sexual Activity: Not on file   Other Topics Concern    . Not on file   Social History Narrative   Hobbies include any outdoor activity.     Past Surgical History  Procedure Laterality Date  . Cyst removal hand Left 04/1977  . Cryo ablasion  2010    Family History  Problem Relation Age of Onset  . Arthritis Mother   . Hypertension Mother   . Arthritis Father   . Cancer Father     lung  . Hyperlipidemia Father   . Heart disease Father   . Hypertension Father   . Hypertension Maternal Grandmother   . Diabetes Maternal Grandmother   . Cancer Paternal Grandmother     breast  . Diabetes Paternal Grandmother     Allergies  Allergen Reactions  . Codeine Other (See Comments)    Hallucinations   . Latex Rash    Blisters    Current Outpatient Prescriptions on File Prior to Visit  Medication Sig Dispense Refill  . aspirin-acetaminophen-caffeine (EXCEDRIN MIGRAINE) 250-250-65 MG per tablet Take by mouth every 6 (six) hours as needed for headache.    . cetirizine (ZYRTEC) 10 MG tablet Take 10 mg by mouth daily.    . fluticasone (FLONASE) 50 MCG/ACT nasal spray Place 1 spray into both nostrils daily.    . hydrochlorothiazide (HYDRODIURIL) 25 MG tablet Take 1 tablet (25 mg total) by mouth daily. 90 tablet 1  .  lisinopril (PRINIVIL,ZESTRIL) 10 MG tablet Take 1 tablet (10 mg total) by mouth daily. 90 tablet 1  . metFORMIN (GLUCOPHAGE) 500 MG tablet Take 1 tablet (500 mg total) by mouth 2 (two) times daily with a meal. 180 tablet 1  . Multiple Vitamin (MULTIVITAMIN) tablet Take 1 tablet by mouth daily.    . naproxen sodium (ANAPROX) 220 MG tablet Take 220 mg by mouth as needed.    Marland Kitchen omeprazole (PRILOSEC) 20 MG capsule Take 20 mg by mouth daily.     No current facility-administered medications on file prior to visit.       Objective:   Physical Exam  Constitutional: She is oriented to person, place, and time. She appears well-developed and well-nourished. No distress.  BP 122/72 mmHg  Pulse 81  Temp(Src) 97.1 F (36.2 C) (Oral)   Resp 14  Ht  (1.6 m)  Wt 204 lb (92.534 kg)  BMI 36.15 kg/m2  SpO2 97%   HENT:  Head: Normocephalic and atraumatic.  Right Ear: External ear normal.  Left Ear: External ear normal.  Eyes: Right eye exhibits no discharge. Left eye exhibits no discharge. No scleral icterus.  Neck: Normal range of motion. Neck supple. No thyromegaly present.  Cardiovascular: Normal rate, regular rhythm, normal heart sounds and intact distal pulses.  Exam reveals no gallop and no friction rub.   No murmur heard. Pulmonary/Chest: Effort normal and breath sounds normal. No respiratory distress. She has no wheezes. She has no rales. She exhibits no tenderness.  Lymphadenopathy:    She has no cervical adenopathy.  Neurological: She is alert and oriented to person, place, and time. No cranial nerve deficit. She exhibits normal muscle tone. Coordination normal.  Skin: Skin is warm and dry. No rash noted. She is not diaphoretic.  Psychiatric: She has a normal mood and affect. Her behavior is normal. Judgment and thought content normal.      Assessment & Plan:

## 2015-01-07 NOTE — Patient Instructions (Addendum)
We will contact you about your referral to [facility]  Eye exam- schedule one to check your eyes for diabetic changes  Follow up in 6 months.

## 2015-01-08 NOTE — Assessment & Plan Note (Signed)
Stop Imitrex and try Maxalt 10 mg once and then again 2 hours later if needed for migraine. Asked her to keep in touch and let me know if it does not work. FU 6 months.

## 2015-01-08 NOTE — Assessment & Plan Note (Signed)
Pt is compliant and working on her health. [facility], and asked her to get a yearly eye exam with retina imaging to check for diabetic changes. BS running great at home. FU in 6 months.

## 2015-03-18 ENCOUNTER — Other Ambulatory Visit: Payer: Self-pay | Admitting: *Deleted

## 2015-03-18 MED ORDER — RIZATRIPTAN BENZOATE 10 MG PO TABS: 10.0000 mg | ORAL_TABLET | ORAL | Status: DC | PRN

## 2015-05-17 ENCOUNTER — Other Ambulatory Visit: Payer: Self-pay | Admitting: Internal Medicine

## 2015-06-17 LAB — HM DIABETES EYE EXAM

## 2015-08-09 ENCOUNTER — Other Ambulatory Visit: Payer: Self-pay | Admitting: Internal Medicine

## 2015-10-06 ENCOUNTER — Ambulatory Visit (INDEPENDENT_AMBULATORY_CARE_PROVIDER_SITE_OTHER): Payer: BLUE CROSS/BLUE SHIELD | Admitting: Family Medicine

## 2015-10-06 ENCOUNTER — Encounter: Payer: Self-pay | Admitting: Family Medicine

## 2015-10-06 ENCOUNTER — Telehealth: Payer: Self-pay | Admitting: Nurse Practitioner

## 2015-10-06 VITALS — BP 134/80 | HR 101 | Temp 98.3°F | Ht 63.0 in | Wt 205.5 lb

## 2015-10-06 DIAGNOSIS — A499 Bacterial infection, unspecified: Secondary | ICD-10-CM

## 2015-10-06 DIAGNOSIS — H109 Unspecified conjunctivitis: Secondary | ICD-10-CM | POA: Insufficient documentation

## 2015-10-06 DIAGNOSIS — H1089 Other conjunctivitis: Secondary | ICD-10-CM | POA: Diagnosis not present

## 2015-10-06 MED ORDER — POLYMYXIN B-TRIMETHOPRIM 10000-0.1 UNIT/ML-% OP SOLN: 1.0000 [drp] | Freq: Four times a day (QID) | OPHTHALMIC | Status: DC

## 2015-10-06 NOTE — Progress Notes (Signed)
Pre visit review using our clinic review tool, if applicable. No additional management support is needed unless otherwise documented below in the visit note. 

## 2015-10-06 NOTE — Patient Instructions (Signed)
Use the drop as prescribed for the next 5-7 days.  Call if you fail to improve or worsen.  Take care  Dr. Adriana Simasook

## 2015-10-06 NOTE — Progress Notes (Signed)
Subjective:  Patient ID: Kathryn BridgemanSuzanne J Parrillo, female    DOB: January 05, 1966  Age: 49 y.o. MRN: [id number]  CC: ? Pink Eye  HPI:  49 year old female presents to clinic today for an acute visit with concerns for pink eye.  Patient reports that yesterday she developed eye redness. She subsequently developed associated drainage and crusting. She states that the drainage as well as the crest was discolored. She states that her eye was matted shut this morning. She endorses a foreign-body sensation. No recent foreign body exposure that she is aware of. No associated fevers or chills. No acute vision change, photophobia or severe eye pain. No exacerbating or relieving factors. No known inciting event.  Social Hx   Social History   Social History  . Marital Status: Married    Spouse Name: N/A  . Number of Children: 2  . Years of Education: 16   Occupational History  . Substitute Teacher    Social History Main Topics  . Smoking status: Never Smoker   . Smokeless tobacco: None  . Alcohol Use: Yes     Comment: occasional  . Drug Use: No  . Sexual Activity: Not Asked   Other Topics Concern  . None   Social History Narrative   Hobbies include any outdoor activity.     Review of Systems  Constitutional: Negative.   Eyes: Positive for discharge, redness and itching.   Objective:  BP 134/80 mmHg  Pulse 101  Temp(Src) 98.3 F (36.8 C) (Oral)  Ht 5\' 3"  (1.6 m)  Wt 205 lb 8 oz (93.214 kg)  BMI 36.41 kg/m2  SpO2 96%  BP/Weight 10/06/2015 01/07/2015 09/24/2014  Systolic BP 134 122 126  Diastolic BP 80 72 78  Wt. (Lbs) 205.5 204 219.75  BMI 36.41 36.15 38.03    Physical Exam  Constitutional: She is oriented to person, place, and time. She appears well-developed. No distress.  HENT:  Head: Normocephalic and atraumatic.  Mouth/Throat: Oropharynx is clear and moist.  Eyes:  Right eye - injected conjunctiva. EOMI. PEERLA. No drainage on exam today.   Neck: Neck supple.    Cardiovascular: Normal rate and regular rhythm.   Pulmonary/Chest: Effort normal and breath sounds normal.  Lymphadenopathy:    She has no cervical adenopathy.  Neurological: She is alert and oriented to person, place, and time.  Psychiatric: She has a normal mood and affect.  Vitals reviewed.  Lab Results  Component Value Date   GLUCOSE 133* 09/24/2014   CHOL 187 05/14/2014   TRIG 154.0* 05/14/2014   HDL 39.40 05/14/2014   LDLCALC 117* 05/14/2014   ALT 20 09/24/2014   AST 24 09/24/2014   NA 136 09/24/2014   K 3.8 09/24/2014   CL 99 09/24/2014   CREATININE 0.6 09/24/2014   BUN 12 09/24/2014   CO2 27 09/24/2014   HGBA1C 6.8* 09/24/2014   MICROALBUR 0.7 09/24/2014    Assessment & Plan:   Problem List Items Addressed This Visit    Bacterial conjunctivitis of right eye - Primary    New problem. Exam and history consistent with bacterial conjunctivitis. Treating with Polytrim. Rx sent.        Meds ordered this encounter  Medications  . trimethoprim-polymyxin b (POLYTRIM) ophthalmic solution    Sig: Place 1 drop into the right eye every 6 (six) hours.    Dispense:  10 mL    Refill:  0    Follow-up: Return if symptoms worsen or fail to improve.  Everlene OtherJayce Shady Bradish DO  [place]

## 2015-10-06 NOTE — Telephone Encounter (Signed)
Please advise 

## 2015-10-06 NOTE — Telephone Encounter (Signed)
Naomie DeanCarrie Doss is listed as PCP. Pt LVM wanting to schedule her diabetes course. Please advise.

## 2015-10-06 NOTE — Assessment & Plan Note (Signed)
New problem. Exam and history consistent with bacterial conjunctivitis. Treating with Polytrim. Rx sent.

## 2015-10-07 ENCOUNTER — Other Ambulatory Visit: Payer: Self-pay | Admitting: Nurse Practitioner

## 2015-10-07 NOTE — Telephone Encounter (Signed)
Left a message with her husband to have her call the office to schedule a f/u with C.Doss and then the referral can be done.

## 2015-10-07 NOTE — Telephone Encounter (Signed)
She will need to be seen for Diabetes follow up with labs before I refer since it was March that I last saw her. Thanks!

## 2015-11-21 ENCOUNTER — Other Ambulatory Visit: Payer: Self-pay | Admitting: Internal Medicine

## 2016-01-15 ENCOUNTER — Ambulatory Visit (INDEPENDENT_AMBULATORY_CARE_PROVIDER_SITE_OTHER): Payer: BLUE CROSS/BLUE SHIELD | Admitting: Nurse Practitioner

## 2016-01-15 ENCOUNTER — Encounter: Payer: Self-pay | Admitting: Nurse Practitioner

## 2016-01-15 VITALS — BP 124/80 | HR 92 | Temp 98.2°F | Wt 206.0 lb

## 2016-01-15 DIAGNOSIS — J069 Acute upper respiratory infection, unspecified: Secondary | ICD-10-CM | POA: Diagnosis not present

## 2016-01-15 MED ORDER — METHYLPREDNISOLONE 4 MG PO TABS: ORAL_TABLET | ORAL | Status: DC

## 2016-01-15 NOTE — Progress Notes (Signed)
Patient ID: Kathryn Henderson, female    DOB: [birth year]  Age: 50 y.o. MRN: [id number]  CC: Cough; Sinusitis; and Otalgia   HPI Kathryn Henderson presents for CC of cough, rhinorrhea, otalgia x 2 weeks.   1) [facility] last Tuesday and Friday  Augmentin- finishing tonight, tessalon- minimal relief Postussive vomiting  Rhinorrhea- yellow w/ blood tinge  Intermittent dizziness Right ear bothering her   History Kathryn Henderson has a past medical history of GERD (gastroesophageal reflux disease); Ulcer; Allergy; History of chicken pox; Migraine; and [facility] spotted fever) (2004).   She has past surgical history that includes Cyst removal hand (Left, 04/1977) and cryo ablasion (2010).   Her family history includes Arthritis in her father and mother; Cancer in her father and paternal grandmother; Diabetes in her maternal grandmother and paternal grandmother; Heart disease in her father; Hyperlipidemia in her father; Hypertension in her father, maternal grandmother, and mother.She reports that she has never smoked. She does not have any smokeless tobacco history on file. She reports that she drinks alcohol. She reports that she does not use illicit drugs.  Outpatient Prescriptions Prior to Visit  Medication Sig Dispense Refill  . aspirin-acetaminophen-caffeine (EXCEDRIN MIGRAINE) 250-250-65 MG per tablet Take by mouth every 6 (six) hours as needed for headache.    . cetirizine (ZYRTEC) 10 MG tablet Take 10 mg by mouth daily.    . fluticasone (FLONASE) 50 MCG/ACT nasal spray Place 1 spray into both nostrils daily.    . hydrochlorothiazide (HYDRODIURIL) 25 MG tablet TAKE ONE TABLET EVERY DAY 90 tablet 2  . lisinopril (PRINIVIL,ZESTRIL) 10 MG tablet Take 1 tablet (10 mg total) by mouth daily. NEED APPT ASAP FOR FURTHER REFILLS. PLEASE CALL OFFICE SOON. 90 tablet 0  . metFORMIN (GLUCOPHAGE) 500 MG tablet TAKE ONE TABLET BY MOUTH TWICE DAILY WITH MEALS 180 tablet 1  . Multiple Vitamin  (MULTIVITAMIN) tablet Take 1 tablet by mouth daily.    . naproxen sodium (ANAPROX) 220 MG tablet Take 220 mg by mouth as needed.    Marland Kitchen omeprazole (PRILOSEC) 20 MG capsule Take 20 mg by mouth as needed.     . rizatriptan (MAXALT) 10 MG tablet Take 1 tablet (10 mg total) by mouth as needed for migraine. May repeat in 2 hours if needed 10 tablet 3  . trimethoprim-polymyxin b (POLYTRIM) ophthalmic solution Place 1 drop into the right eye every 6 (six) hours. 10 mL 0   No facility-administered medications prior to visit.    ROS Review of Systems  Constitutional: Positive for fatigue. Negative for fever, chills and diaphoresis.  HENT: Positive for congestion, ear pain, postnasal drip, rhinorrhea and sinus pressure. Negative for sore throat.   Respiratory: Positive for cough. Negative for chest tightness, shortness of breath and wheezing.   Cardiovascular: Negative for chest pain, palpitations and leg swelling.  Gastrointestinal: Positive for vomiting. Negative for nausea and diarrhea.  Skin: Negative for rash.  Neurological: Positive for light-headedness. Negative for dizziness and headaches.    Objective:  BP 124/80 mmHg  Pulse 92  Temp(Src) 98.2 F (36.8 C) (Oral)  Wt 206 lb (93.441 kg)  SpO2 98%  Physical Exam  Constitutional: She is oriented to person, place, and time. She appears well-developed and well-nourished. No distress.  HENT:  Head: Normocephalic and atraumatic.  Right Ear: External ear normal.  Left Ear: External ear normal.  Mouth/Throat: Oropharynx is clear and moist. No oropharyngeal exudate.  TMs clear bilaterally  Eyes: EOM are normal. Pupils are equal,  round, and reactive to light. Right eye exhibits no discharge. Left eye exhibits no discharge. No scleral icterus.  Neck: Normal range of motion. Neck supple.  Cardiovascular: Normal rate and regular rhythm.   Pulmonary/Chest: Effort normal and breath sounds normal. No respiratory distress. She has no wheezes. She  has no rales. She exhibits no tenderness.  Lymphadenopathy:    She has no cervical adenopathy.  Neurological: She is alert and oriented to person, place, and time.  Skin: Skin is warm and dry. No rash noted. She is not diaphoretic.  Psychiatric: She has a normal mood and affect. Her behavior is normal. Judgment and thought content normal.   Assessment & Plan:   There are no diagnoses linked to this encounter. I have discontinued Kathryn Henderson's trimethoprim-polymyxin b. I am also having her start on methylPREDNISolone. Additionally, I am having her maintain her cetirizine, fluticasone, omeprazole, multivitamin, naproxen sodium, aspirin-acetaminophen-caffeine, rizatriptan, hydrochlorothiazide, metFORMIN, lisinopril, amoxicillin-clavulanate, PROAIR HFA, and benzonatate.  Meds ordered this encounter  Medications  . amoxicillin-clavulanate (AUGMENTIN) 875-125 MG tablet    Sig:   . PROAIR HFA 108 (90 Base) MCG/ACT inhaler    Sig:   . benzonatate (TESSALON) 100 MG capsule    Sig:   . methylPREDNISolone (MEDROL) 4 MG tablet    Sig: Take 6 tablets by mouth with breakfast or lunch and decrease by 1 tablet each day until gone.    Dispense:  21 tablet    Refill:  0    Order Specific Question:  Supervising Provider    Answer:  Sherlene ShamsULLO, TERESA L [[id number]]     Follow-up: Return if symptoms worsen or fail to improve.

## 2016-01-15 NOTE — Patient Instructions (Signed)
Prednisone with breakfast or lunch at the latest.  6 tablets on day 1, 5 tablets on day 2, 4 tablets on day 3, 3 tablets on day 4, 2 tablets day 5, 1 tablet on day 6...done! Take tablets all together not spaced out Don't take with NSAIDs (Ibuprofen, Aleve, Naproxen, Meloxicam ect...) 

## 2016-01-15 NOTE — Progress Notes (Signed)
Pre visit review using our clinic review tool, if applicable. No additional management support is needed unless otherwise documented below in the visit note. 

## 2016-01-25 DIAGNOSIS — J069 Acute upper respiratory infection, unspecified: Secondary | ICD-10-CM | POA: Insufficient documentation

## 2016-01-25 NOTE — Assessment & Plan Note (Signed)
New onset Will treat conservatively due to probable viral nature Prednisone taper to decrease inflammation given  Instructions for taking done verbally and on AVS Finish Augmentin and tessalon perles Mucinex plain OTC FU prn worsening/failure to improve.

## 2016-02-23 ENCOUNTER — Other Ambulatory Visit: Payer: Self-pay | Admitting: Nurse Practitioner

## 2016-04-11 DIAGNOSIS — J029 Acute pharyngitis, unspecified: Secondary | ICD-10-CM | POA: Diagnosis not present

## 2016-04-11 DIAGNOSIS — J0140 Acute pansinusitis, unspecified: Secondary | ICD-10-CM | POA: Diagnosis not present

## 2016-04-16 ENCOUNTER — Other Ambulatory Visit: Payer: Self-pay | Admitting: Nurse Practitioner

## 2016-04-21 NOTE — Telephone Encounter (Signed)
Refill request for MAXALT , last seen 16MAR2017, last filled [id number].  Please advise.

## 2016-08-02 ENCOUNTER — Encounter: Payer: Self-pay | Admitting: Family Medicine

## 2016-08-02 ENCOUNTER — Ambulatory Visit (INDEPENDENT_AMBULATORY_CARE_PROVIDER_SITE_OTHER): Payer: BLUE CROSS/BLUE SHIELD | Admitting: Family Medicine

## 2016-08-02 VITALS — BP 156/102 | HR 82 | Temp 98.2°F

## 2016-08-02 DIAGNOSIS — I10 Essential (primary) hypertension: Secondary | ICD-10-CM

## 2016-08-02 DIAGNOSIS — R21 Rash and other nonspecific skin eruption: Secondary | ICD-10-CM | POA: Insufficient documentation

## 2016-08-02 DIAGNOSIS — Z1239 Encounter for other screening for malignant neoplasm of breast: Secondary | ICD-10-CM

## 2016-08-02 DIAGNOSIS — E119 Type 2 diabetes mellitus without complications: Secondary | ICD-10-CM | POA: Diagnosis not present

## 2016-08-02 DIAGNOSIS — Z1231 Encounter for screening mammogram for malignant neoplasm of breast: Secondary | ICD-10-CM | POA: Diagnosis not present

## 2016-08-02 DIAGNOSIS — S46819A Strain of other muscles, fascia and tendons at shoulder and upper arm level, unspecified arm, initial encounter: Secondary | ICD-10-CM | POA: Insufficient documentation

## 2016-08-02 MED ORDER — LISINOPRIL 10 MG PO TABS: ORAL_TABLET | ORAL | 3 refills | Status: DC

## 2016-08-02 MED ORDER — METFORMIN HCL 500 MG PO TABS: 500.0000 mg | ORAL_TABLET | Freq: Two times a day (BID) | ORAL | 1 refills | Status: DC

## 2016-08-02 MED ORDER — HYDROCHLOROTHIAZIDE 25 MG PO TABS: 25.0000 mg | ORAL_TABLET | Freq: Every day | ORAL | 2 refills | Status: DC

## 2016-08-02 MED ORDER — CYCLOBENZAPRINE HCL 10 MG PO TABS: 10.0000 mg | ORAL_TABLET | Freq: Three times a day (TID) | ORAL | 0 refills | Status: DC | PRN

## 2016-08-02 NOTE — Patient Instructions (Addendum)
Nice to meet you. I refilled your medications. We'll try Flexeril for your neck discomfort. We'll get you scheduled for mammogram. We'll get you to see the diabetic educator. If you develop worsening neck pain, fevers, numbness, weakness, or any new or changing symptoms please seek medical attention.

## 2016-08-02 NOTE — Assessment & Plan Note (Signed)
Refill metformin. A1c rechecked today. Foot exam completed. We'll check a lipid panel and once this returns likely start her on Lipitor given her diabetes history.

## 2016-08-02 NOTE — Assessment & Plan Note (Addendum)
Bilateral trapezius strain. Suspect likely irritated when dancing at a wedding and worsened overnight. Neurologically intact in her upper and lower extremities. No fevers. We will treat with Flexeril. I warned that this could make her drowsy. She can continue heat. 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 She is given return precautions.

## 2016-08-02 NOTE — Assessment & Plan Note (Signed)
No evidence of rash at this time on her lower legs. She'll continue to monitor. She'll let us know if this recurs.

## 2016-08-02 NOTE — Assessment & Plan Note (Signed)
Not at goal. Has been out of her medications. We'll refill these today. CMP and lipid panel.

## 2016-08-02 NOTE — Progress Notes (Signed)
Tommi Rumps, MD Phone: [phone]  Kathryn Henderson is a 50 y.o. female who presents today for follow-up.  HYPERTENSION Disease Monitoring: Blood pressure range-typically in the 120s over 70s, she ran out of her medications one week ago. Has not checked since running out. Chest pain- no      Dyspnea- no Medications: Compliance- taking lisinopril, HCTZ   Edema- no  DIABETES Disease Monitoring: Blood Sugar ranges-140s Polyuria/phagia/dipsia- no      ophthalmology- has seen in the last year Medications: Compliance- taking metformin, has been out for the last week Hypoglycemic symptoms- no  Patient notes several days ago she was at a wedding and had been dancing. Noted her neck felt a little tight after this and woke up the next morning and she had difficulty turning her neck due to pain. Notes her neck feels tight and has some discomfort when she rotates. One of her colleagues placed some physio-tape on it and she had improvement in her range of motion today though there is tightness now in the areas where there is no tape. She reports no numbness or weakness. No fevers. 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  Patient additionally reports having had a rash last week on her legs that started at her ankles and spread up her legs that has now resolved. She had no itching with this. No pain with it. No fevers. No change in soaps, detergents, or medications. She had no fevers with it. She feels well at this time other than her neck.   PMH: nonsmoker.   ROS see history of present illness  Objective  Physical Exam Vitals:   08/02/16 1527  BP: (!) 156/102  Pulse: 82  Temp: 98.2 F (36.8 C)    BP Readings from Last 3 Encounters:  08/02/16 (!) 156/102  01/15/16 124/80  10/06/15 134/80   Wt Readings from Last 3 Encounters:  01/15/16 206 lb (93.4 kg)  10/06/15 205 lb 8 oz (93.2 kg)  01/07/15 204 lb (92.5 kg)    Physical Exam  Constitutional: She is  well-developed, well-nourished, and in no distress.  HENT:  Head: Normocephalic and atraumatic.  Mouth/Throat: Oropharynx is clear and moist.  Cardiovascular: Normal rate, regular rhythm and normal heart sounds.   Pulmonary/Chest: Effort normal and breath sounds normal.  Musculoskeletal:  No midline spine tenderness, no midline spine step-off, there is tenderness over the full length of the trapezius with spasm bilaterally  Neurological: She is alert. Gait normal.  5/5 strength in bilateral biceps, triceps, grip, quads, hamstrings, plantar and dorsiflexion, sensation to light touch intact in bilateral UE and LE, normal gait, 2+ patellar reflexes  Skin: Skin is warm and dry. No rash noted.   Diabetic Foot Exam - Simple   Simple Foot Form Diabetic Foot exam was performed with the following findings:  Yes 08/02/2016  3:44 PM  Visual Inspection No deformities, no ulcerations, no other skin breakdown bilaterally:  Yes Sensation Testing Intact to touch and monofilament testing bilaterally:  Yes Pulse Check Posterior Tibialis and Dorsalis pulse intact bilaterally:  Yes Comments     Assessment/Plan: Please see individual problem list.  Essential hypertension Not at goal. Has been out of her medications. We'll refill these today. CMP and lipid panel.  Type II diabetes mellitus Refill metformin. A1c rechecked today. Foot exam completed. We'll check a lipid panel and once this returns likely start her on Lipitor given her diabetes history.  Trapezius strain Bilateral trapezius strain. Suspect likely irritated when dancing  at a wedding and worsened overnight. Neurologically intact in her upper and lower extremities. No fevers. We will treat with Flexeril. I warned that this could make her drowsy. She can continue heat. 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 She is given return precautions.  Rash and nonspecific skin eruption No evidence of rash  at this time on her lower legs. She'll continue to monitor. She'll let us know if this recurs.   Orders Placed This Encounter  Procedures  . MM Digital Screening    Standing Status:   Future    Standing Expiration Date:   10/02/2017    Order Specific Question:   Reason for Exam (SYMPTOM  OR DIAGNOSIS REQUIRED)    Answer:   screening mammogram    Order Specific Question:   Is the patient pregnant?    Answer:   No    Order Specific Question:   Preferred imaging location?    Answer:   [facility]  . Comp Met (CMET)  . HgB A1c  . Lipid Profile  . Ambulatory referral to diabetic education    Referral Priority:   Routine    Referral Type:   Consultation    Referral Reason:   Specialty Services Required    Number of Visits Requested:   1    Meds ordered this encounter  Medications  . cyclobenzaprine (FLEXERIL) 10 MG tablet    Sig: Take 1 tablet (10 mg total) by mouth 3 (three) times daily as needed for muscle spasms.    Dispense:  10 tablet    Refill:  0  . metFORMIN (GLUCOPHAGE) 500 MG tablet    Sig: Take 1 tablet (500 mg total) by mouth 2 (two) times daily with a meal.    Dispense:  180 tablet    Refill:  1  . lisinopril (PRINIVIL,ZESTRIL) 10 MG tablet    Sig: TAKE 1 TABLET BY MOUTH DAILY    Dispense:  90 tablet    Refill:  3  . hydrochlorothiazide (HYDRODIURIL) 25 MG tablet    Sig: Take 1 tablet (25 mg total) by mouth daily.    Dispense:  90 tablet    Refill:  2    Tommi Rumps, MD [place]

## 2016-08-03 LAB — LIPID PANEL
Cholesterol: 158 mg/dL (ref 0–200)
HDL: 48.7 mg/dL (ref >39.00)
LDL Cholesterol: 96 mg/dL (ref 0–99)
NonHDL: 109.61
Total CHOL/HDL Ratio: 3
Triglycerides: 70 mg/dL (ref 0.0–149.0)
VLDL: 14 mg/dL (ref 0.0–40.0)

## 2016-08-03 LAB — COMPREHENSIVE METABOLIC PANEL
ALT: 13 U/L (ref 0–35)
AST: 15 U/L (ref 0–37)
Albumin: 4.3 g/dL (ref 3.5–5.2)
Alkaline Phosphatase: 78 U/L (ref 39–117)
BUN: 9 mg/dL (ref 6–23)
CO2: 29 mEq/L (ref 19–32)
Calcium: 9.5 mg/dL (ref 8.4–10.5)
Chloride: 104 mEq/L (ref 96–112)
Creatinine, Ser: 0.55 mg/dL (ref 0.40–1.20)
GFR: 124.32 mL/min (ref >60.00)
Glucose, Bld: 95 mg/dL (ref 70–99)
Potassium: 4.3 mEq/L (ref 3.5–5.1)
Sodium: 142 mEq/L (ref 135–145)
Total Bilirubin: 0.8 mg/dL (ref 0.2–1.2)
Total Protein: 8 g/dL (ref 6.0–8.3)

## 2016-08-03 LAB — HEMOGLOBIN A1C: Hgb A1c MFr Bld: 6 % (ref 4.6–6.5)

## 2016-08-04 ENCOUNTER — Other Ambulatory Visit: Payer: Self-pay | Admitting: *Deleted

## 2016-08-04 ENCOUNTER — Inpatient Hospital Stay
Admission: RE | Admit: 2016-08-04 | Discharge: 2016-08-04 | Disposition: A | Discharge status: Home / Self Care | Payer: Self-pay | Source: Ambulatory Visit | Attending: *Deleted | Admitting: *Deleted

## 2016-08-04 ENCOUNTER — Telehealth: Payer: Self-pay | Admitting: Family Medicine

## 2016-08-04 ENCOUNTER — Other Ambulatory Visit: Payer: Self-pay | Admitting: Family Medicine

## 2016-08-04 DIAGNOSIS — Z9289 Personal history of other medical treatment: Secondary | ICD-10-CM

## 2016-08-04 DIAGNOSIS — E785 Hyperlipidemia, unspecified: Secondary | ICD-10-CM

## 2016-08-04 MED ORDER — ATORVASTATIN CALCIUM 20 MG PO TABS: 20.0000 mg | ORAL_TABLET | Freq: Every day | ORAL | 3 refills | Status: DC

## 2016-08-04 NOTE — Telephone Encounter (Signed)
Pt called returning your call regarding lab results. Thank you!  Call pt @ [phone]

## 2016-08-04 NOTE — Telephone Encounter (Signed)
Spoke with patient. See results note.  

## 2016-08-12 ENCOUNTER — Ambulatory Visit (INDEPENDENT_AMBULATORY_CARE_PROVIDER_SITE_OTHER): Payer: BLUE CROSS/BLUE SHIELD

## 2016-08-12 VITALS — BP 138/68 | HR 95 | Resp 18

## 2016-08-12 DIAGNOSIS — I10 Essential (primary) hypertension: Secondary | ICD-10-CM

## 2016-08-12 NOTE — Progress Notes (Signed)
Patient came in for BP check.  No issues with current medications now that she has been back on them. Was here on 10/2 to get refills after being off meds for more then 5 days. BP checked on BP upper extremities, see vitals section.   Please advise

## 2016-08-12 NOTE — Progress Notes (Signed)
BP reviewed. Well controlled. She should continue current medications.  Marikay AlarEric Sissi Padia, M.D.

## 2016-08-12 NOTE — Progress Notes (Signed)
Left a detailed message that she should continue current medications. thanks

## 2016-08-17 ENCOUNTER — Encounter: Payer: BLUE CROSS/BLUE SHIELD | Attending: Family Medicine | Admitting: Dietician

## 2016-08-17 ENCOUNTER — Encounter: Payer: Self-pay | Admitting: Dietician

## 2016-08-17 VITALS — BP 106/74 | Ht 65.75 in | Wt 208.0 lb

## 2016-08-17 DIAGNOSIS — Z713 Dietary counseling and surveillance: Secondary | ICD-10-CM | POA: Insufficient documentation

## 2016-08-17 DIAGNOSIS — E119 Type 2 diabetes mellitus without complications: Secondary | ICD-10-CM | POA: Insufficient documentation

## 2016-08-17 NOTE — Progress Notes (Signed)
Diabetes Self-Management Education  Visit Type: First/Initial  Appt. Start Time: 1030 Appt. End Time:1130  08/17/2016  Ms. Kathryn SaxonSuzanne Henderson, identified by name and date of birth, is a 50 y.o. female with a diagnosis of Diabetes: Type 2.   ASSESSMENT  Blood pressure 106/74, height 5' 5.75" (1.67 m), weight 208 lb (94.3 kg). Body mass index is 33.83 kg/m.  c/o occasional numbness right hand      Diabetes Self-Management Education - 08/17/16 1430      Visit Information   Visit Type First/Initial     Initial Visit   Diabetes Type Type 2     Health Coping   How would you rate your overall health? Good     Psychosocial Assessment   Patient Belief/Attitude about Diabetes Motivated to manage diabetes   Self-care barriers None   Patient Concerns Weight Control;Healthy Lifestyle  become more fit; prevent complications   Special Needs None   Preferred Learning Style Auditory;Visual;Hands on   Learning Readiness Ready   What is the last grade level you completed in school? 16     Pre-Education Assessment   Patient understands the diabetes disease and treatment process. Needs Review   Patient understands incorporating nutritional management into lifestyle. Needs Review   Patient undertands incorporating physical activity into lifestyle. Needs Review   Patient understands using medications safely. Needs Review   Patient understands monitoring blood glucose, interpreting and using results Needs Review   Patient understands prevention, detection, and treatment of acute complications. Needs Instruction   Patient understands prevention, detection, and treatment of chronic complications. Needs Instruction   Patient understands how to develop strategies to address psychosocial issues. Needs Instruction   Patient understands how to develop strategies to promote health/change behavior. Needs Review     Complications   Last HgB A1C per patient/outside source 6 %  (08-2016)   How often do  you check your blood sugar? 1-2 times/day   Fasting Blood glucose range (mg/dL) 82-956;[phone];130-179   Postprandial Blood glucose range (mg/dL) 57-846;[phone];130-179   Have you had a dilated eye exam in the past 12 months? No  06-2015   Have you had a dental exam in the past 12 months? Yes  goes every 6 months-appt 09-2016   Are you checking your feet? Yes   How many days per week are you checking your feet? 2     Dietary Intake   Breakfast --  eats 3 meals/day-breakfast at 7-7:30a   Snack (morning) --  none   Lunch --  eats lunch at 11a-12p=eats fried foods and sweets 2-3x/wk.   Snack (afternoon) --  none   Dinner --  eats supper at 7p   Snack (evening) --  eats ice cream, fruit, carrots with pnut butter or chips at 10p; eats snack foods 4-5x/wk.   Beverage(s) --  drinks 8+ glasses of water/day; drinks occasional glass of orange juice and 1-2 glasses of milk daily     Exercise   Exercise Type Light (walking / raking leaves)  currently walks dog 30-45 min 3x/wk.   How many days per week to you exercise? 3     Patient Education   Previous Diabetes Education No   Disease state  Definition of diabetes, type 1 and 2, and the diagnosis of diabetes;Explored patient's options for treatment of their diabetes   Nutrition management  Food label reading, portion sizes and measuring food.;Carbohydrate counting;Role of diet in the treatment of diabetes and the relationship between the three main macronutrients and blood  glucose level   Physical activity and exercise  Role of exercise on diabetes management, blood pressure control and cardiac health.;Helped patient identify appropriate exercises in relation to his/her diabetes, diabetes complications and other health issue.   Medications Reviewed patients medication for diabetes, action, purpose, timing of dose and side effects.   Monitoring Purpose and frequency of SMBG.;Taught/discussed recording of test results and interpretation of SMBG.;Identified  appropriate SMBG and/or A1C goals.;Yearly dilated eye exam   Chronic complications Relationship between chronic complications and blood glucose control;Dental care;Retinopathy and reason for yearly dilated eye exams   Personal strategies to promote health Lifestyle issues that need to be addressed for better diabetes care;Helped patient develop diabetes management plan for (enter comment)     Outcomes   Expected Outcomes Demonstrated interest in learning. Expect positive outcomes      Individualized Plan for Diabetes Self-Management Training:   Learning Objective:  Patient will have a greater understanding of diabetes self-management. Patient education plan is to attend individual and/or group sessions per assessed needs and concerns.   Plan:   Patient Instructions   Check blood sugars 2 x day before breakfast and 2 hrs after supper every day and record Exercise:  Walk  for    30-45  minutes   4  days a week Avoid sugar sweetened drinks (soda, tea, coffee, sports drinks, juices) Limit intake of fried foods and sweets Make healthy food choices Eat 3 meals day,   2  snacks a day-in AM or afternoon and at bedtime Space meals 4-6 hours apart Make eye doctor appointment Bring blood sugar records to the next appointment/class Get a Sharps container Return for appointment/classes on:  08-23-16   Expected Outcomes:  Demonstrated interest in learning. Expect positive outcomes  Education material provided: General meal planning guidelines  If problems or questions, patient to contact team via:  [phone]  Future DSME appointment:  08-23-16

## 2016-08-17 NOTE — Patient Instructions (Signed)
  Check blood sugars 2 x day before breakfast and 2 hrs after supper every day and record Exercise:  Walk  for    30-45  minutes   4  days a week Avoid sugar sweetened drinks (soda, tea, coffee, sports drinks, juices) Limit intake of fried foods and sweets Make healthy food choices Eat 3 meals day,   2  snacks a day-in AM or afternoon and at bedtime Space meals 4-6 hours apart Make eye doctor appointment Bring blood sugar records to the next appointment/class Get a Sharps container Return for appointment/classes on:  08-23-16

## 2016-08-19 ENCOUNTER — Telehealth: Payer: Self-pay | Admitting: Family Medicine

## 2016-08-19 DIAGNOSIS — R928 Other abnormal and inconclusive findings on diagnostic imaging of breast: Secondary | ICD-10-CM

## 2016-08-19 NOTE — Telephone Encounter (Signed)
Can you please place order

## 2016-08-19 NOTE — Telephone Encounter (Signed)
Kathryn Henderson states need new order stating Diagnostic bilateral rt and lft US of rmass in left breast follow up in year. Please and Thank you!

## 2016-08-20 NOTE — Telephone Encounter (Signed)
Orders placed.

## 2016-08-23 ENCOUNTER — Encounter: Payer: BLUE CROSS/BLUE SHIELD | Admitting: Dietician

## 2016-08-23 ENCOUNTER — Encounter: Payer: Self-pay | Admitting: Dietician

## 2016-08-23 VITALS — Ht 65.75 in | Wt 212.0 lb

## 2016-08-23 DIAGNOSIS — Z713 Dietary counseling and surveillance: Secondary | ICD-10-CM | POA: Diagnosis not present

## 2016-08-23 DIAGNOSIS — E119 Type 2 diabetes mellitus without complications: Secondary | ICD-10-CM

## 2016-08-23 NOTE — Progress Notes (Signed)

## 2016-08-25 ENCOUNTER — Encounter: Payer: Self-pay | Admitting: Family Medicine

## 2016-08-27 ENCOUNTER — Telehealth: Payer: Self-pay | Admitting: Family Medicine

## 2016-08-27 MED ORDER — PRAVASTATIN SODIUM 40 MG PO TABS: 40.0000 mg | ORAL_TABLET | Freq: Every day | ORAL | 3 refills | Status: DC

## 2016-08-27 NOTE — Telephone Encounter (Signed)
Spoke with patient about medication change. She is fine with the change. Her sugars started running high two days after starting taking the Lipitor. She will discontinue this and start the pravastatin. Her sugars are not back to normal due to still taking the medication. She will call us back in a week if her numbers are not back in range. I advised if she starts to have any symptoms with sugars being high she will need to be seen. Please send new medication to pharmacy

## 2016-08-27 NOTE — Telephone Encounter (Signed)
We can change her to pravastatin given that her sugars are running high. Please have her discontinue the Lipitor. Please check and see if her sugars have returned back to normal.

## 2016-08-27 NOTE — Telephone Encounter (Signed)
Pt called and stated that she since she has been taking atorvastatin (LIPITOR) 20 MG tablet her blood sugar has been running high. She called her pharmacy to ask if this is normal, they did some research and stated that it is rare but could happen. They recommended that she be switched to Pravastatin. Pt stated that blood sugar has been good since before the atorvastatin (LIPITOR) 20 MG tablet. Please advise, thank you!  Fasting blood sugar  number about - 140 2 hours after eating - 230  Call pt @ [phone]  Ph[place] [facility] [place] [address]

## 2016-08-27 NOTE — Telephone Encounter (Signed)
Please advise 

## 2016-08-27 NOTE — Telephone Encounter (Signed)
Sent to pharmacy 

## 2016-08-30 ENCOUNTER — Encounter: Payer: Self-pay | Admitting: Dietician

## 2016-08-30 ENCOUNTER — Encounter: Payer: BLUE CROSS/BLUE SHIELD | Admitting: Dietician

## 2016-08-30 VITALS — Wt 207.2 lb

## 2016-08-30 DIAGNOSIS — Z713 Dietary counseling and surveillance: Secondary | ICD-10-CM | POA: Diagnosis not present

## 2016-08-30 DIAGNOSIS — E119 Type 2 diabetes mellitus without complications: Secondary | ICD-10-CM | POA: Diagnosis not present

## 2016-08-30 NOTE — Progress Notes (Signed)

## 2016-09-06 ENCOUNTER — Encounter: Payer: Self-pay | Admitting: Dietician

## 2016-09-06 ENCOUNTER — Encounter: Payer: BLUE CROSS/BLUE SHIELD | Attending: Family Medicine | Admitting: Dietician

## 2016-09-06 VITALS — BP 120/80 | Wt 206.1 lb

## 2016-09-06 DIAGNOSIS — Z713 Dietary counseling and surveillance: Secondary | ICD-10-CM | POA: Diagnosis not present

## 2016-09-06 DIAGNOSIS — E119 Type 2 diabetes mellitus without complications: Secondary | ICD-10-CM | POA: Diagnosis not present

## 2016-09-06 NOTE — Progress Notes (Signed)

## 2016-09-07 ENCOUNTER — Other Ambulatory Visit: Payer: BLUE CROSS/BLUE SHIELD

## 2016-09-14 ENCOUNTER — Encounter: Payer: Self-pay | Admitting: Dietician

## 2016-09-16 ENCOUNTER — Other Ambulatory Visit (INDEPENDENT_AMBULATORY_CARE_PROVIDER_SITE_OTHER): Payer: BLUE CROSS/BLUE SHIELD

## 2016-09-16 DIAGNOSIS — E785 Hyperlipidemia, unspecified: Secondary | ICD-10-CM | POA: Diagnosis not present

## 2016-09-17 ENCOUNTER — Other Ambulatory Visit: Payer: Self-pay | Admitting: Family Medicine

## 2016-09-17 ENCOUNTER — Ambulatory Visit: Payer: BLUE CROSS/BLUE SHIELD

## 2016-09-17 ENCOUNTER — Ambulatory Visit
Admission: RE | Admit: 2016-09-17 | Discharge: 2016-09-17 | Disposition: A | Discharge status: Home / Self Care | Payer: BLUE CROSS/BLUE SHIELD | Source: Ambulatory Visit | Attending: Family Medicine | Admitting: Family Medicine

## 2016-09-17 ENCOUNTER — Other Ambulatory Visit: Payer: BLUE CROSS/BLUE SHIELD

## 2016-09-17 DIAGNOSIS — R928 Other abnormal and inconclusive findings on diagnostic imaging of breast: Secondary | ICD-10-CM

## 2016-09-17 LAB — HEPATIC FUNCTION PANEL
ALT: 18 U/L (ref 0–35)
AST: 20 U/L (ref 0–37)
Albumin: 4.5 g/dL (ref 3.5–5.2)
Alkaline Phosphatase: 69 U/L (ref 39–117)
Bilirubin, Direct: 0.1 mg/dL (ref 0.0–0.3)
Total Bilirubin: 0.7 mg/dL (ref 0.2–1.2)
Total Protein: 7.5 g/dL (ref 6.0–8.3)

## 2016-09-17 LAB — LDL CHOLESTEROL, DIRECT: Direct LDL: 79 mg/dL

## 2016-11-03 ENCOUNTER — Other Ambulatory Visit: Payer: Self-pay | Admitting: Family Medicine

## 2016-11-03 NOTE — Telephone Encounter (Signed)
Sent to pharmacy 

## 2016-11-03 NOTE — Telephone Encounter (Signed)
Last filled 08/02/16 180 1rf, last A1C 08/02/16 6.0

## 2016-11-08 ENCOUNTER — Encounter: Payer: Self-pay | Admitting: Family Medicine

## 2016-11-08 ENCOUNTER — Ambulatory Visit (INDEPENDENT_AMBULATORY_CARE_PROVIDER_SITE_OTHER): Payer: BLUE CROSS/BLUE SHIELD | Admitting: Family Medicine

## 2016-11-08 VITALS — BP 110/68 | HR 86 | Temp 98.2°F | Wt 208.2 lb

## 2016-11-08 DIAGNOSIS — E119 Type 2 diabetes mellitus without complications: Secondary | ICD-10-CM

## 2016-11-08 DIAGNOSIS — Z23 Encounter for immunization: Secondary | ICD-10-CM

## 2016-11-08 LAB — MICROALBUMIN / CREATININE URINE RATIO
Creatinine,U: 122.5 mg/dL
Microalb Creat Ratio: 0.6 mg/g (ref 0.0–30.0)
Microalb, Ur: <0.7 mg/dL (ref 0.0–1.9)

## 2016-11-08 LAB — HEMOGLOBIN A1C: Hgb A1c MFr Bld: 6.2 % (ref 4.6–6.5)

## 2016-11-08 NOTE — Assessment & Plan Note (Signed)
At goal. Continue current medications. 

## 2016-11-08 NOTE — Progress Notes (Signed)
  Marikay AlarEric Mako Pelfrey, MD Phone: [phone]  Janae BridgemanSuzanne J Henderson is a 51 y.o. female who presents today for f/u.  HYPERTENSION  Disease Monitoring  Home BP Monitoring similar to today at home Chest pain- no    Dyspnea- no Medications  Compliance-  Taking HCTZ.  Edema- no  DIABETES Disease Monitoring: Blood Sugar ranges-have been running higher than previously. Now in the 140s to 150s. Had a 181 this morning. Polyuria/phagia/dipsia- no      ophthalmology- is due for a visit Medications: Compliance- taking metformin Hypoglycemic symptoms- no Patient was also recently placed on pravastatin for cardiovascular protection given that she has diabetes and hypertension. She has no issues with this. No myalgias or right upper quadrant pain. She had been on Lipitor though this ran her blood sugars up. Currently on pravastatin.   PMH: nonsmoker.   ROS see history of present illness  Objective  Physical Exam Vitals:   11/08/16 0810  BP: 110/68  Pulse: 86  Temp: 98.2 F (36.8 C)    BP Readings from Last 3 Encounters:  11/08/16 110/68  09/06/16 120/80  08/17/16 106/74   Wt Readings from Last 3 Encounters:  11/08/16 208 lb 3.2 oz (94.4 kg)  09/06/16 206 lb 1.6 oz (93.5 kg)  08/30/16 207 lb 3.2 oz (94 kg)    Physical Exam  Constitutional: No distress.  Cardiovascular: Normal rate, regular rhythm and normal heart sounds.   Pulmonary/Chest: Effort normal and breath sounds normal.  Musculoskeletal: She exhibits no edema.  Neurological: She is alert.  Skin: Skin is warm and dry. She is not diaphoretic.     Assessment/Plan: Please see individual problem list.  Essential hypertension At goal. Continue current medications.  Type II diabetes mellitus Home blood sugars have worsened since we last saw each other. We'll check an A1c today and make a determination on treatment based off of that. She'll continue metformin. She'll continue pravastatin for cardiovascular risk  protection.   Orders Placed This Encounter  Procedures  . Flu Vaccine QUAD 36+ mos IM  . HgB A1c  . Urine Microalbumin w/creat. ratio   We will see her back in 3 months for a physical exam.  Marikay AlarEric Brisia Schuermann, MD [facility] [place] [facility]

## 2016-11-08 NOTE — Progress Notes (Signed)
Pre visit review using our clinic review tool, if applicable. No additional management support is needed unless otherwise documented below in the visit note. 

## 2016-11-08 NOTE — Patient Instructions (Signed)
Nice to see you. Please try to increase your exercise. Please continue your current blood pressure and diabetes medications.

## 2016-11-08 NOTE — Assessment & Plan Note (Signed)
Home blood sugars have worsened since we last saw each other. We'll check an A1c today and make a determination on treatment based off of that. She'll continue metformin. She'll continue pravastatin for cardiovascular risk protection.

## 2016-11-12 ENCOUNTER — Telehealth: Payer: Self-pay | Admitting: Family Medicine

## 2016-11-12 NOTE — Telephone Encounter (Signed)
See other mesage 

## 2016-11-12 NOTE — Telephone Encounter (Signed)
Pt returning your call. Thank you!  Call pt @ [phone] (may Leave msg)

## 2017-01-05 ENCOUNTER — Ambulatory Visit (INDEPENDENT_AMBULATORY_CARE_PROVIDER_SITE_OTHER): Payer: BLUE CROSS/BLUE SHIELD | Admitting: Family Medicine

## 2017-01-05 ENCOUNTER — Encounter: Payer: Self-pay | Admitting: Family Medicine

## 2017-01-05 ENCOUNTER — Telehealth: Payer: Self-pay | Admitting: Radiology

## 2017-01-05 ENCOUNTER — Other Ambulatory Visit (HOSPITAL_COMMUNITY)
Admission: RE | Admit: 2017-01-05 | Discharge: 2017-01-05 | Disposition: A | Discharge status: Home / Self Care | Payer: BLUE CROSS/BLUE SHIELD | Source: Ambulatory Visit | Attending: Family Medicine | Admitting: Family Medicine

## 2017-01-05 VITALS — BP 122/80 | HR 84 | Temp 98.6°F | Ht 64.0 in | Wt 206.4 lb

## 2017-01-05 DIAGNOSIS — Z01419 Encounter for gynecological examination (general) (routine) without abnormal findings: Secondary | ICD-10-CM | POA: Diagnosis not present

## 2017-01-05 DIAGNOSIS — R1310 Dysphagia, unspecified: Secondary | ICD-10-CM

## 2017-01-05 DIAGNOSIS — Z124 Encounter for screening for malignant neoplasm of cervix: Secondary | ICD-10-CM

## 2017-01-05 DIAGNOSIS — R200 Anesthesia of skin: Secondary | ICD-10-CM | POA: Diagnosis not present

## 2017-01-05 DIAGNOSIS — Z0001 Encounter for general adult medical examination with abnormal findings: Secondary | ICD-10-CM | POA: Diagnosis not present

## 2017-01-05 DIAGNOSIS — Z1151 Encounter for screening for human papillomavirus (HPV): Secondary | ICD-10-CM | POA: Insufficient documentation

## 2017-01-05 DIAGNOSIS — R350 Frequency of micturition: Secondary | ICD-10-CM | POA: Diagnosis not present

## 2017-01-05 DIAGNOSIS — J309 Allergic rhinitis, unspecified: Secondary | ICD-10-CM | POA: Diagnosis not present

## 2017-01-05 DIAGNOSIS — K219 Gastro-esophageal reflux disease without esophagitis: Secondary | ICD-10-CM

## 2017-01-05 DIAGNOSIS — Z0000 Encounter for general adult medical examination without abnormal findings: Secondary | ICD-10-CM | POA: Insufficient documentation

## 2017-01-05 DIAGNOSIS — Z01411 Encounter for gynecological examination (general) (routine) with abnormal findings: Secondary | ICD-10-CM | POA: Insufficient documentation

## 2017-01-05 LAB — POCT URINALYSIS DIPSTICK
Bilirubin, UA: NEGATIVE
Glucose, UA: NEGATIVE
Ketones, UA: NEGATIVE
Nitrite, UA: NEGATIVE
Protein, UA: NEGATIVE
Spec Grav, UA: 1.025
Urobilinogen, UA: 0.2
pH, UA: 5

## 2017-01-05 NOTE — Telephone Encounter (Signed)
noted 

## 2017-01-05 NOTE — Assessment & Plan Note (Signed)
Continue Flonase and Zyrtec. 

## 2017-01-05 NOTE — Telephone Encounter (Signed)
Please do urine culture and microscopy. Thanks.

## 2017-01-05 NOTE — Assessment & Plan Note (Signed)
Patient with GERD and trouble swallowing. Refer to GI for further evaluation.

## 2017-01-05 NOTE — Patient Instructions (Signed)
Nice to see you. Please try to work on diet and exercise. We will get you back to see GI. We'll contact you with Pap smear results. If the numbness changes were spread 30 develop any other symptoms please seek medical attention.

## 2017-01-05 NOTE — Telephone Encounter (Signed)
Pt called back and stated that she is going to try and come back this afternoon or tomorrow morning.   Call pt @ [phone] (may leave msg)

## 2017-01-05 NOTE — Progress Notes (Signed)
Pre visit review using our clinic review tool, if applicable. No additional management support is needed unless otherwise documented below in the visit note. 

## 2017-01-05 NOTE — Telephone Encounter (Signed)
Pt came in to drop off urine sample, UA was ran with trace blood, would you like urine culture?

## 2017-01-05 NOTE — Telephone Encounter (Signed)
Saw urine dip ordered for pt, but no urine was in lab bathroom or in the box in the pod. Would you like pt to come back for urine? Please let CMA know if you do. Thank you.
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
## 2017-01-05 NOTE — Assessment & Plan Note (Signed)
Patient with intermittent scattered finger numbness bilaterally. There is no specific nerve root that this is affected by. I wonder if this could be some type of slight nerve impingement within her hand given the fairly isolated distribution within her fingers. She is neurologically intact today. Discussed monitoring and if not improving referring to neurology. If worsens or develops any new symptoms she will be evaluated again.

## 2017-01-05 NOTE — Assessment & Plan Note (Addendum)
Physical exam completed. Encouraged diet and exercise. We'll refer to gastroenterology given her trouble swallowing and for possible colonoscopy. Lab work has been obtained within the last 6 months. Pap smear completed.

## 2017-01-05 NOTE — Assessment & Plan Note (Signed)
Suspect this is related to her HCTZ use. We will check a UA to evaluate for other potential causes.

## 2017-01-06 LAB — URINE CULTURE

## 2017-01-06 LAB — URINALYSIS, MICROSCOPIC ONLY
RBC / HPF: NONE SEEN (ref 0–?)
WBC, UA: NONE SEEN (ref 0–?)

## 2017-01-06 NOTE — Addendum Note (Signed)
Addended by: Penne LashWIGGINS, Haadi Santellan N on: 01/06/2017 08:15 AM   Modules accepted: Orders

## 2017-01-06 NOTE — Addendum Note (Signed)
Addended by: Penne LashWIGGINS, Yarielis Funaro N on: 01/06/2017 08:16 AM   Modules accepted: Orders

## 2017-01-07 ENCOUNTER — Telehealth: Payer: Self-pay

## 2017-01-07 NOTE — Telephone Encounter (Signed)
-----   Message from Glori LuisEric G Sonnenberg, MD sent at 01/07/2017 10:34 AM EST ----- Please let the patient know that her urine did not reveal an infection. It did reveal colonizing bacteria that is found on external and internal genitals. Thanks.

## 2017-01-07 NOTE — Telephone Encounter (Signed)
Left message to return call 

## 2017-01-10 LAB — CYTOLOGY - PAP
Diagnosis: NEGATIVE
HPV: NOT DETECTED

## 2017-01-10 NOTE — Telephone Encounter (Signed)
Patient requested a call at [phone], a message can be left on the voicemail.

## 2017-01-10 NOTE — Telephone Encounter (Signed)
Left message to notify

## 2017-02-05 ENCOUNTER — Other Ambulatory Visit: Payer: Self-pay | Admitting: Family Medicine

## 2017-02-07 ENCOUNTER — Ambulatory Visit: Payer: BLUE CROSS/BLUE SHIELD | Admitting: Family Medicine

## 2017-02-07 DIAGNOSIS — Z1211 Encounter for screening for malignant neoplasm of colon: Secondary | ICD-10-CM | POA: Diagnosis not present

## 2017-02-07 DIAGNOSIS — R1310 Dysphagia, unspecified: Secondary | ICD-10-CM | POA: Diagnosis not present

## 2017-02-07 NOTE — Telephone Encounter (Signed)
Refilled: 04/21/16 Last OV: 01/05/17 Last Labs: 11/08/16 Future OV:  05/10/17 Please advise?

## 2017-02-16 DIAGNOSIS — D127 Benign neoplasm of rectosigmoid junction: Secondary | ICD-10-CM | POA: Diagnosis not present

## 2017-02-16 DIAGNOSIS — E785 Hyperlipidemia, unspecified: Secondary | ICD-10-CM | POA: Diagnosis not present

## 2017-02-16 DIAGNOSIS — D128 Benign neoplasm of rectum: Secondary | ICD-10-CM | POA: Diagnosis not present

## 2017-02-16 DIAGNOSIS — K635 Polyp of colon: Secondary | ICD-10-CM | POA: Diagnosis not present

## 2017-02-16 DIAGNOSIS — D120 Benign neoplasm of cecum: Secondary | ICD-10-CM | POA: Diagnosis not present

## 2017-02-16 DIAGNOSIS — K219 Gastro-esophageal reflux disease without esophagitis: Secondary | ICD-10-CM | POA: Diagnosis not present

## 2017-02-16 DIAGNOSIS — D126 Benign neoplasm of colon, unspecified: Secondary | ICD-10-CM | POA: Diagnosis not present

## 2017-02-16 DIAGNOSIS — Z800 Family history of malignant neoplasm of digestive organs: Secondary | ICD-10-CM | POA: Diagnosis not present

## 2017-02-16 DIAGNOSIS — Z1211 Encounter for screening for malignant neoplasm of colon: Secondary | ICD-10-CM | POA: Diagnosis not present

## 2017-02-16 DIAGNOSIS — K5730 Diverticulosis of large intestine without perforation or abscess without bleeding: Secondary | ICD-10-CM | POA: Diagnosis not present

## 2017-04-07 ENCOUNTER — Ambulatory Visit: Payer: BLUE CROSS/BLUE SHIELD | Admitting: Family Medicine

## 2017-04-23 ENCOUNTER — Telehealth: Payer: Self-pay | Admitting: Family Medicine

## 2017-04-23 NOTE — Telephone Encounter (Signed)
Opened in error

## 2017-05-10 ENCOUNTER — Ambulatory Visit (INDEPENDENT_AMBULATORY_CARE_PROVIDER_SITE_OTHER): Payer: BLUE CROSS/BLUE SHIELD | Admitting: Family Medicine

## 2017-05-10 ENCOUNTER — Encounter: Payer: Self-pay | Admitting: Family Medicine

## 2017-05-10 VITALS — BP 114/70 | HR 87 | Temp 98.4°F | Wt 211.6 lb

## 2017-05-10 DIAGNOSIS — I10 Essential (primary) hypertension: Secondary | ICD-10-CM

## 2017-05-10 DIAGNOSIS — E119 Type 2 diabetes mellitus without complications: Secondary | ICD-10-CM | POA: Diagnosis not present

## 2017-05-10 DIAGNOSIS — K219 Gastro-esophageal reflux disease without esophagitis: Secondary | ICD-10-CM | POA: Diagnosis not present

## 2017-05-10 LAB — COMPREHENSIVE METABOLIC PANEL
ALT: 20 U/L (ref 0–35)
AST: 17 U/L (ref 0–37)
Albumin: 4.4 g/dL (ref 3.5–5.2)
Alkaline Phosphatase: 80 U/L (ref 39–117)
BUN: 11 mg/dL (ref 6–23)
CO2: 23 mEq/L (ref 19–32)
Calcium: 9.8 mg/dL (ref 8.4–10.5)
Chloride: 102 mEq/L (ref 96–112)
Creatinine, Ser: 0.57 mg/dL (ref 0.40–1.20)
GFR: 118.93 mL/min (ref >60.00)
Glucose, Bld: 139 mg/dL — ABNORMAL HIGH (ref 70–99)
Potassium: 3.9 mEq/L (ref 3.5–5.1)
Sodium: 137 mEq/L (ref 135–145)
Total Bilirubin: 0.9 mg/dL (ref 0.2–1.2)
Total Protein: 7.4 g/dL (ref 6.0–8.3)

## 2017-05-10 LAB — HEMOGLOBIN A1C: Hgb A1c MFr Bld: 6.2 % (ref 4.6–6.5)

## 2017-05-10 NOTE — Assessment & Plan Note (Signed)
Well-controlled. Continue as needed omeprazole.
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
## 2017-05-10 NOTE — Patient Instructions (Signed)
Nice to see you. We will obtain lab work today and contact you with the results. Please continue the as needed omeprazole. Please get set up to see an eye doctor.

## 2017-05-10 NOTE — Assessment & Plan Note (Signed)
At goal.  Continue current medication.  Check CMP. 

## 2017-05-10 NOTE — Assessment & Plan Note (Signed)
Check A1c.  Continue metformin. 

## 2017-06-25 ENCOUNTER — Other Ambulatory Visit: Payer: Self-pay | Admitting: Family Medicine

## 2017-08-19 ENCOUNTER — Other Ambulatory Visit: Payer: Self-pay | Admitting: Family Medicine

## 2017-10-27 ENCOUNTER — Telehealth: Payer: Self-pay

## 2017-10-27 NOTE — Telephone Encounter (Signed)
Copied from CRM [phone]. Topic: Bill or Statement - Patient/Guarantor Inquiry >> Oct 27, 2017  3:30 PM Debroah LoopLander, Lumin L wrote: Patient name/MRN/Acct #: Kathryn Henderson,Kathryn Henderson/[id number]/04-Feb-1966 DOS: 08/12/2016 and 01/05/2017 Details of issue or inquiry: Patient has a collection agency amount of $30 and patient spoke with billing who gave her dates of service but explained that they don't know what went into collections.  Route to appropriate [employer] or [facility] Coding pool.

## 2017-11-07 NOTE — Telephone Encounter (Signed)
Kathryn Henderson, Kathryn Henderson   Service area 100 handles this account. Sorry we only do service 101 area.   Thank  You.

## 2017-11-09 NOTE — Telephone Encounter (Signed)
I called the patient she is aware DOS 10.12.17 was for BP check and 3.7.18 was for lab work . We do not collect co-payments for BP checks and lab work , but her insurance carrier required a co-pay at for each visit. The patient will call collections to make her payment.

## 2017-11-10 ENCOUNTER — Ambulatory Visit: Payer: BLUE CROSS/BLUE SHIELD | Admitting: Family Medicine

## 2017-11-26 ENCOUNTER — Other Ambulatory Visit: Payer: Self-pay | Admitting: Family Medicine

## 2018-01-11 DIAGNOSIS — H43313 Vitreous membranes and strands, bilateral: Secondary | ICD-10-CM | POA: Diagnosis not present

## 2018-02-23 ENCOUNTER — Other Ambulatory Visit: Payer: Self-pay | Admitting: Family Medicine

## 2018-04-14 ENCOUNTER — Other Ambulatory Visit: Payer: Self-pay | Admitting: Family Medicine

## 2018-06-08 ENCOUNTER — Other Ambulatory Visit: Payer: Self-pay | Admitting: Family Medicine

## 2018-09-12 ENCOUNTER — Other Ambulatory Visit: Payer: Self-pay | Admitting: Family Medicine

## 2018-10-10 ENCOUNTER — Encounter: Payer: Self-pay | Admitting: Family Medicine

## 2018-10-10 ENCOUNTER — Ambulatory Visit: Payer: BLUE CROSS/BLUE SHIELD | Admitting: Family Medicine

## 2018-10-10 ENCOUNTER — Encounter

## 2018-10-10 VITALS — BP 112/80 | HR 87 | Temp 98.7°F | Ht 64.0 in | Wt 207.0 lb

## 2018-10-10 DIAGNOSIS — R1032 Left lower quadrant pain: Secondary | ICD-10-CM

## 2018-10-10 DIAGNOSIS — E119 Type 2 diabetes mellitus without complications: Secondary | ICD-10-CM | POA: Diagnosis not present

## 2018-10-10 DIAGNOSIS — R202 Paresthesia of skin: Secondary | ICD-10-CM | POA: Insufficient documentation

## 2018-10-10 DIAGNOSIS — Z1239 Encounter for other screening for malignant neoplasm of breast: Secondary | ICD-10-CM | POA: Diagnosis not present

## 2018-10-10 DIAGNOSIS — I10 Essential (primary) hypertension: Secondary | ICD-10-CM

## 2018-10-10 LAB — POCT URINE PREGNANCY: Preg Test, Ur: NEGATIVE

## 2018-10-10 NOTE — Progress Notes (Signed)
Tommi Rumps, MD Phone: [phone]  Kathryn Henderson is a 52 y.o. female who presents today for follow-up.  CC: Diabetes, left lower quadrant pain, hypertension, foot tingling  Diabetes: Not checking sugars.  Taking metformin.  No polyuria or polydipsia.  No hypoglycemia.  She saw ophthalmology 6 months ago.  Left lower quadrant pain: Patient notes this started on Saturday.  It was sharp and localized in the left lower quadrant.  It would then feel as though it was a gas-like pain that would spread out for just a moment and then resolved.  It was intermittent.  She noted no vomiting though did have some nausea on Saturday.  No diarrhea.  She had a bowel movement Friday morning and then a much smaller bowel movement Saturday and then went several days without a bowel movement and then had a small one last night and a bigger one this morning.  She does note she increased her fiber intake.  She notes no blood in her stool.  No dysuria.  No vaginal discharge.  No change in urination.  She notes the abdominal pain has improved significantly since Saturday.  She had mild discomfort this morning and none since then.  She is status post ablation.  It has been 6 months since she has had any vaginal bleeding.  Hypertension: She is taking hydrochlorothiazide and lisinopril.  No chest pain or shortness of breath.  Mild edema with a sock line about a week ago though this resolved and has not recurred.  No orthopnea or PND.  She is not checking blood pressures.  Foot tingling: Patient reports at work when she is sitting she will feel her feet tingle and she will get up and move around and it will go away.  There is no persistent tingling.  Social History   Tobacco Use  Smoking Status Never Smoker  Smokeless Tobacco Never Used     ROS see history of present illness  Objective  Physical Exam Vitals:   10/10/18 1618  BP: 112/80  Pulse: 87  Temp: 98.7 F (37.1 C)  SpO2: 97%    BP Readings  from Last 3 Encounters:  10/10/18 112/80  05/10/17 114/70  01/05/17 122/80   Wt Readings from Last 3 Encounters:  10/10/18 207 lb (93.9 kg)  05/10/17 211 lb 9.6 oz (96 kg)  01/05/17 206 lb 6.4 oz (93.6 kg)    Physical Exam  Constitutional: No distress.  Cardiovascular: Normal rate, regular rhythm and normal heart sounds.  Pulmonary/Chest: Effort normal and breath sounds normal.  Abdominal: Soft. Bowel sounds are normal. She exhibits no distension. There is tenderness (Minimal tenderness in the left mid abdomen). There is no rebound and no guarding.  Musculoskeletal: She exhibits no edema.  Neurological: She is alert.  Skin: Skin is warm and dry. She is not diaphoretic.   Diabetic Foot Exam - Simple   Simple Foot Form Diabetic Foot exam was performed with the following findings:  Yes 10/10/2018  4:49 PM  Visual Inspection No deformities, no ulcerations, no other skin breakdown bilaterally:  Yes Sensation Testing Intact to touch and monofilament testing bilaterally:  Yes Pulse Check Posterior Tibialis and Dorsalis pulse intact bilaterally:  Yes Comments      Assessment/Plan: Please see individual problem list.  Essential hypertension Adequately controlled today.  Continue current medication.  Lab work as outlined below.  Type II diabetes mellitus Continue metformin.  Check A1c.  Foot exam completed.  LLQ pain Suspect related to constipation given lack of  bowel movements for several days.  Her symptoms have improved significantly.  She has a fairly benign abdominal exam.  We will check a CBC to evaluate a WBC.  Urine pregnancy test negative.  If she has recurrent symptoms she will let us know.  She is given return precautions.  Tingling of both feet I suspect this is a positional issue given that it resolves with change in position.  No findings of neuropathy on exam.  She will monitor.   Health Maintenance: We will request colonoscopy records.  Mammogram ordered.   Patient will call to schedule.  Orders Placed This Encounter  Procedures  . MM 3D SCREEN BREAST BILATERAL    Standing Status:   Future    Standing Expiration Date:   12/12/2019    Order Specific Question:   Reason for Exam (SYMPTOM  OR DIAGNOSIS REQUIRED)    Answer:   breast cancer screening    Order Specific Question:   Is the patient pregnant?    Answer:   No    Order Specific Question:   Preferred imaging location?    Answer:   [facility]  . CBC  . HgB A1c  . Lipid panel  . Comp Met (CMET)  . POCT urine pregnancy    No orders of the defined types were placed in this encounter.    Tommi Rumps, MD [place]

## 2018-10-10 NOTE — Patient Instructions (Signed)
Nice to see you. We will get lab work today and contact you with the results. If your abdominal pain returns please be reevaluated.  If you develop severe pain please go to the emergency room.

## 2018-10-10 NOTE — Assessment & Plan Note (Signed)
I suspect this is a positional issue given that it resolves with change in position.  No findings of neuropathy on exam.  She will monitor.

## 2018-10-10 NOTE — Assessment & Plan Note (Signed)
Suspect related to constipation given lack of bowel movements for several days.  Her symptoms have improved significantly.  She has a fairly benign abdominal exam.  We will check a CBC to evaluate a WBC.  Urine pregnancy test negative.  If she has recurrent symptoms she will let us know.  She is given return precautions.

## 2018-10-10 NOTE — Assessment & Plan Note (Signed)
Adequately controlled today.  Continue current medication.  Lab work as outlined below.

## 2018-10-10 NOTE — Assessment & Plan Note (Signed)
Continue metformin.  Check A1c.  Foot exam completed.

## 2018-10-11 LAB — CBC
HCT: 41.5 % (ref 36.0–46.0)
Hemoglobin: 14.2 g/dL (ref 12.0–15.0)
MCHC: 34.1 g/dL (ref 30.0–36.0)
MCV: 84.1 fl (ref 78.0–100.0)
Platelets: 270 10*3/uL (ref 150.0–400.0)
RBC: 4.93 Mil/uL (ref 3.87–5.11)
RDW: 13.8 % (ref 11.5–15.5)
WBC: 9.6 10*3/uL (ref 4.0–10.5)

## 2018-10-11 LAB — LIPID PANEL
Cholesterol: 151 mg/dL (ref 0–200)
HDL: 50.9 mg/dL (ref >39.00)
LDL Cholesterol: 73 mg/dL (ref 0–99)
NonHDL: 100.09
Total CHOL/HDL Ratio: 3
Triglycerides: 137 mg/dL (ref 0.0–149.0)
VLDL: 27.4 mg/dL (ref 0.0–40.0)

## 2018-10-11 LAB — COMPREHENSIVE METABOLIC PANEL
ALT: 12 U/L (ref 0–35)
AST: 16 U/L (ref 0–37)
Albumin: 4.7 g/dL (ref 3.5–5.2)
Alkaline Phosphatase: 77 U/L (ref 39–117)
BUN: 12 mg/dL (ref 6–23)
CO2: 31 mEq/L (ref 19–32)
Calcium: 9.9 mg/dL (ref 8.4–10.5)
Chloride: 101 mEq/L (ref 96–112)
Creatinine, Ser: 0.62 mg/dL (ref 0.40–1.20)
GFR: 107.33 mL/min (ref >60.00)
Glucose, Bld: 129 mg/dL — ABNORMAL HIGH (ref 70–99)
Potassium: 3.9 mEq/L (ref 3.5–5.1)
Sodium: 139 mEq/L (ref 135–145)
Total Bilirubin: 0.6 mg/dL (ref 0.2–1.2)
Total Protein: 7.7 g/dL (ref 6.0–8.3)

## 2018-10-11 LAB — HEMOGLOBIN A1C: Hgb A1c MFr Bld: 6.4 % (ref 4.6–6.5)

## 2018-10-29 DIAGNOSIS — J019 Acute sinusitis, unspecified: Secondary | ICD-10-CM | POA: Diagnosis not present

## 2018-11-20 ENCOUNTER — Encounter: Payer: Self-pay | Admitting: Family Medicine

## 2018-12-23 DIAGNOSIS — J09X2 Influenza due to identified novel influenza A virus with other respiratory manifestations: Secondary | ICD-10-CM | POA: Diagnosis not present

## 2018-12-23 DIAGNOSIS — I10 Essential (primary) hypertension: Secondary | ICD-10-CM | POA: Diagnosis not present

## 2018-12-27 ENCOUNTER — Other Ambulatory Visit: Payer: Self-pay | Admitting: Family Medicine

## 2019-03-23 ENCOUNTER — Other Ambulatory Visit: Payer: Self-pay | Admitting: Family Medicine

## 2019-04-11 ENCOUNTER — Other Ambulatory Visit: Payer: Self-pay

## 2019-04-11 ENCOUNTER — Ambulatory Visit (INDEPENDENT_AMBULATORY_CARE_PROVIDER_SITE_OTHER): Payer: BC Managed Care – PPO | Admitting: Family Medicine

## 2019-04-11 ENCOUNTER — Encounter: Payer: Self-pay | Admitting: Family Medicine

## 2019-04-11 ENCOUNTER — Telehealth: Payer: Self-pay

## 2019-04-11 DIAGNOSIS — E119 Type 2 diabetes mellitus without complications: Secondary | ICD-10-CM | POA: Diagnosis not present

## 2019-04-11 DIAGNOSIS — Z20822 Contact with and (suspected) exposure to covid-19: Secondary | ICD-10-CM | POA: Insufficient documentation

## 2019-04-11 DIAGNOSIS — Z20828 Contact with and (suspected) exposure to other viral communicable diseases: Secondary | ICD-10-CM | POA: Diagnosis not present

## 2019-04-11 DIAGNOSIS — I10 Essential (primary) hypertension: Secondary | ICD-10-CM | POA: Diagnosis not present

## 2019-04-11 NOTE — Telephone Encounter (Signed)
Copied from Christine [id number]. Topic: General - Other >> Apr 11, 2019  9:57 AM Virl Axe D wrote: Reason for CRM: Pt stated that her husband tested negative for Covid 19 and wanted to share this with Dr. Caryl Bis. Please advise.

## 2019-04-11 NOTE — Telephone Encounter (Signed)
Noted.  That is good to hear.  Thanks.

## 2019-04-11 NOTE — Assessment & Plan Note (Signed)
Reports adequately controlled.  Continue metformin.  Check A1c.  Discussed potentially coming off metformin if her A1c is very well controlled.

## 2019-04-11 NOTE — Assessment & Plan Note (Signed)
Patient reports possible exposure to COVID-19 though symptoms may be allergy related.  The patient has been asymptomatic and has been under quarantine.  Discussed continuing quarantine until lab evaluation for her husband has been completed.  She will monitor for symptoms and contact us if they occur.

## 2019-04-11 NOTE — Progress Notes (Signed)
Virtual Visit via video note  This visit type was conducted due to national recommendations for restrictions regarding the COVID-19 pandemic (e.g. social distancing).  This format is felt to be most appropriate for this patient at this time.  All issues noted in this document were discussed and addressed.  No physical exam was performed (except for noted visual exam findings with Video Visits).   I connected with Kathryn Henderson today at  8:00 AM EDT by a video enabled telemedicine application and verified that I am speaking with the correct person using two identifiers. Location patient: car, not driving Location provider: work Persons participating in the virtual visit: patient, provider  I discussed the limitations, risks, security and privacy concerns of performing an evaluation and management service by telephone and the availability of in person appointments. I also discussed with the patient that there may be a patient responsible charge related to this service. The patient expressed understanding and agreed to proceed.   Reason for visit: Follow-up.  HPI: Hypertension: Typically 118/75.  Taking HCTZ and lisinopril.  No chest pain or shortness of breath.  Only rarely has ankle edema at the end of a very long day on her feet.  Diabetes: She notes her blood sugars are ranging good though she does not have the numbers for me.  Taking metformin.  Occasional increased thirst when it is quite hot at work.  No hypoglycemia.  She last saw ophthalmology 8 months ago.  She has increased her exercise quite a bit with playing disc golf, foot golf, hiking, and playing tennis.  Possible COVID-19 exposure: Patient notes her husband is being tested for COVID-19.  She has been under quarantine since he has had the test.  She notes he has had allergy symptoms and has a history of allergies though she notes he is likely only being tested because she works in a healthcare facility and they are being  cautious.  She has had no cough, fever, congestion, or shortness of breath.   ROS: See pertinent positives and negatives per HPI.  Past Medical History:  Diagnosis Date  . Allergy   . GERD (gastroesophageal reflux disease)   . History of chicken pox   . History of nerve impingement    right elbow  . Hypertension   . Migraine   . Migraines   . RMSF [facility] spotted fever) 2004  . Ulcer     Past Surgical History:  Procedure Laterality Date  . BREAST BIOPSY Left 06/10/2014   neg  . cryo ablasion  2010  . CYST REMOVAL HAND Left 04/1977    Family History  Problem Relation Age of Onset  . Arthritis Mother   . Hypertension Mother   . Arthritis Father   . Cancer Father        lung  . Hyperlipidemia Father   . Heart disease Father   . Hypertension Father   . Hypertension Maternal Grandmother   . Diabetes Maternal Grandmother   . Cancer Paternal Grandmother        breast  . Diabetes Paternal Grandmother   . Breast cancer Paternal Grandmother 2       bilateral    SOCIAL HX: Non-smoker   Current Outpatient Medications:  .  aspirin-acetaminophen-caffeine (EXCEDRIN MIGRAINE) 250-250-65 MG per tablet, Take by mouth every 6 (six) hours as needed for headache., Disp: , Rfl:  .  cetirizine (ZYRTEC) 10 MG tablet, Take 10 mg by mouth daily., Disp: , Rfl:  .  fluticasone (  FLONASE) 50 MCG/ACT nasal spray, Place 1 spray into both nostrils daily., Disp: , Rfl:  .  hydrochlorothiazide (HYDRODIURIL) 25 MG tablet, TAKE ONE TABLET EVERY DAY, Disp: 90 tablet, Rfl: 0 .  ibuprofen (ADVIL,MOTRIN) 200 MG tablet, Take 200 mg by mouth every 6 (six) hours as needed., Disp: , Rfl:  .  lisinopril (ZESTRIL) 10 MG tablet, TAKE ONE TABLET EVERY DAY NEEDS APPT FORMORE REFILLS, Disp: 90 tablet, Rfl: 0 .  metFORMIN (GLUCOPHAGE) 500 MG tablet, TAKE ONE TABLET TWICE DAILY WITH A MEAL, Disp: 180 tablet, Rfl: 0 .  Multiple Vitamins-Minerals (MULTIVITAMIN ADULT PO), Take by mouth daily., Disp: , Rfl:   .  omeprazole (PRILOSEC) 20 MG capsule, Take 20 mg by mouth as needed. , Disp: , Rfl:  .  pravastatin (PRAVACHOL) 40 MG tablet, TAKE ONE TABLET EVERY DAY, Disp: 90 tablet, Rfl: 1 .  rizatriptan (MAXALT-MLT) 10 MG disintegrating tablet, Take 1 tablet by mouth as needed for migraine. May repeat in 2 hours if needed. No more than 2 doses within 24 hours., Disp: 10 tablet, Rfl: 0  EXAM:  VITALS per patient if applicable: None  GENERAL: alert, oriented, appears well and in no acute distress  HEENT: atraumatic, conjunttiva clear, no obvious abnormalities on inspection of external nose and ears  NECK: normal movements of the head and neck  LUNGS: on inspection no signs of respiratory distress, breathing rate appears normal, no obvious gross SOB, gasping or wheezing  CV: no obvious cyanosis  MS: moves all visible extremities without noticeable abnormality  PSYCH/NEURO: pleasant and cooperative, no obvious depression or anxiety, speech and thought processing grossly intact  ASSESSMENT AND PLAN:  Discussed the following assessment and plan:  Essential hypertension  Type 2 diabetes mellitus without complication, without long-term current use of insulin (HCC)  Exposure to viral disease  Essential hypertension Well-controlled.  Continue current medication.  We will have her come in for labs in 1 to 2 months.  Type II diabetes mellitus Reports adequately controlled.  Continue metformin.  Check A1c.  Discussed potentially coming off metformin if her A1c is very well controlled.  Possible exposure to viral illness Patient reports possible exposure to COVID-19 though symptoms may be allergy related.  The patient has been asymptomatic and has been under quarantine.  Discussed continuing quarantine until lab evaluation for her husband has been completed.  She will monitor for symptoms and contact us if they occur.  Front office staff will contact the patient to schedule follow-up in 4 months  and lab work in 1 month.  Social distancing precautions and sick precautions given regarding COVID-19.   I discussed the assessment and treatment plan with the patient. The patient was provided an opportunity to ask questions and all were answered. The patient agreed with the plan and demonstrated an understanding of the instructions.   The patient was advised to call back or seek an in-person evaluation if the symptoms worsen or if the condition fails to improve as anticipated.   Marikay AlarEric Porchia Sinkler, MD

## 2019-04-11 NOTE — Assessment & Plan Note (Signed)
Well-controlled.  Continue current medication.  We will have her come in for labs in 1 to 2 months.

## 2019-04-30 ENCOUNTER — Other Ambulatory Visit: Payer: Self-pay

## 2019-04-30 ENCOUNTER — Ambulatory Visit: Payer: Self-pay

## 2019-04-30 ENCOUNTER — Telehealth: Payer: Self-pay | Admitting: Family Medicine

## 2019-04-30 ENCOUNTER — Emergency Department: Payer: BC Managed Care – PPO

## 2019-04-30 ENCOUNTER — Emergency Department
Admission: EM | Admit: 2019-04-30 | Discharge: 2019-04-30 | Disposition: A | Discharge status: Home / Self Care | Payer: BC Managed Care – PPO | Attending: Emergency Medicine | Admitting: Emergency Medicine

## 2019-04-30 DIAGNOSIS — I10 Essential (primary) hypertension: Secondary | ICD-10-CM

## 2019-04-30 DIAGNOSIS — E119 Type 2 diabetes mellitus without complications: Secondary | ICD-10-CM | POA: Diagnosis not present

## 2019-04-30 DIAGNOSIS — Z79899 Other long term (current) drug therapy: Secondary | ICD-10-CM | POA: Insufficient documentation

## 2019-04-30 DIAGNOSIS — D259 Leiomyoma of uterus, unspecified: Secondary | ICD-10-CM | POA: Diagnosis not present

## 2019-04-30 DIAGNOSIS — K8020 Calculus of gallbladder without cholecystitis without obstruction: Secondary | ICD-10-CM | POA: Diagnosis not present

## 2019-04-30 DIAGNOSIS — Z7984 Long term (current) use of oral hypoglycemic drugs: Secondary | ICD-10-CM | POA: Diagnosis not present

## 2019-04-30 DIAGNOSIS — R112 Nausea with vomiting, unspecified: Secondary | ICD-10-CM | POA: Insufficient documentation

## 2019-04-30 DIAGNOSIS — Z20828 Contact with and (suspected) exposure to other viral communicable diseases: Secondary | ICD-10-CM | POA: Insufficient documentation

## 2019-04-30 DIAGNOSIS — R1010 Upper abdominal pain, unspecified: Secondary | ICD-10-CM | POA: Diagnosis not present

## 2019-04-30 DIAGNOSIS — R1011 Right upper quadrant pain: Secondary | ICD-10-CM | POA: Diagnosis not present

## 2019-04-30 DIAGNOSIS — Z03818 Encounter for observation for suspected exposure to other biological agents ruled out: Secondary | ICD-10-CM | POA: Diagnosis not present

## 2019-04-30 HISTORY — DX: Type 2 diabetes mellitus without complications: E11.9

## 2019-04-30 LAB — COMPREHENSIVE METABOLIC PANEL
ALT: 14 U/L (ref 0–44)
AST: 18 U/L (ref 15–41)
Albumin: 4.8 g/dL (ref 3.5–5.0)
Alkaline Phosphatase: 83 U/L (ref 38–126)
Anion gap: 13 (ref 5–15)
BUN: 10 mg/dL (ref 6–20)
CO2: 25 mmol/L (ref 22–32)
Calcium: 9.6 mg/dL (ref 8.9–10.3)
Chloride: 100 mmol/L (ref 98–111)
Creatinine, Ser: 0.55 mg/dL (ref 0.44–1.00)
GFR calc Af Amer: >60 mL/min (ref >60)
GFR calc non Af Amer: >60 mL/min (ref >60)
Glucose, Bld: 147 mg/dL — ABNORMAL HIGH (ref 70–99)
Potassium: 3.3 mmol/L — ABNORMAL LOW (ref 3.5–5.1)
Sodium: 138 mmol/L (ref 135–145)
Total Bilirubin: 1.2 mg/dL (ref 0.3–1.2)
Total Protein: 8.4 g/dL — ABNORMAL HIGH (ref 6.5–8.1)

## 2019-04-30 LAB — CBC
HCT: 41.1 % (ref 36.0–46.0)
Hemoglobin: 14 g/dL (ref 12.0–15.0)
MCH: 28.2 pg (ref 26.0–34.0)
MCHC: 34.1 g/dL (ref 30.0–36.0)
MCV: 82.7 fL (ref 80.0–100.0)
Platelets: 281 10*3/uL (ref 150–400)
RBC: 4.97 MIL/uL (ref 3.87–5.11)
RDW: 13.1 % (ref 11.5–15.5)
WBC: 13.3 10*3/uL — ABNORMAL HIGH (ref 4.0–10.5)
nRBC: 0 % (ref 0.0–0.2)

## 2019-04-30 LAB — URINALYSIS, COMPLETE (UACMP) WITH MICROSCOPIC
Bacteria, UA: NONE SEEN
Bilirubin Urine: NEGATIVE
Glucose, UA: NEGATIVE mg/dL
Hgb urine dipstick: NEGATIVE
Ketones, ur: 20 mg/dL — AB
Leukocytes,Ua: NEGATIVE
Nitrite: NEGATIVE
Protein, ur: NEGATIVE mg/dL
Specific Gravity, Urine: 1.009 (ref 1.005–1.030)
pH: 6 (ref 5.0–8.0)

## 2019-04-30 LAB — TROPONIN I (HIGH SENSITIVITY): Troponin I (High Sensitivity): <2 ng/L (ref <18)

## 2019-04-30 LAB — PREGNANCY, URINE: Preg Test, Ur: NEGATIVE

## 2019-04-30 LAB — LIPASE, BLOOD: Lipase: 23 U/L (ref 11–51)

## 2019-04-30 MED ORDER — IOHEXOL 300 MG/ML  SOLN
100.0000 mL | Freq: Once | INTRAMUSCULAR | Status: AC | PRN
  Administered 2019-04-30: 100 mL via INTRAVENOUS

## 2019-04-30 MED ORDER — SODIUM CHLORIDE 0.9% FLUSH: 3.0000 mL | Freq: Once | INTRAVENOUS | Status: DC

## 2019-04-30 NOTE — Telephone Encounter (Signed)
Pt called stating that her BP is elevated and she is having upper abdominal pain she describes as gas.  She states this pain moves to her back. All symptoms started yesterday. Today her BP is 162/101, and 155/100 HR are high at 92 and 99. She states she has history of elevated HR. Per protocol pt will go to ER for evaluation of her symptoms. Care advice read to patient. Patient verbalized understanding of all instructions.  Reason for Disposition . [7] Systolic BP  >= 494 OR Diastolic >= 496 AND [7] cardiac or neurologic symptoms (e.g., chest pain, difficulty breathing, unsteady gait, blurred vision)  Answer Assessment - Initial Assessment Questions 1. BLOOD PRESSURE: "What is the blood pressure?" "Did you take at least two measurements 5 minutes apart?"     162/101, 155/100  HR92  2. ONSET: "When did you take your blood pressure?"     today 3. HOW: "How did you obtain the blood pressure?" (e.g., visiting nurse, automatic home BP monitor)     Home 4. HISTORY: "Do you have a history of high blood pressure?"     yes 5. MEDICATIONS: "Are you taking any medications for blood pressure?" "Have you missed any doses recently?"    no 6. OTHER SYMPTOMS: "Do you have any symptoms?" (e.g., headache, chest pain, blurred vision, difficulty breathing, weakness)     Upper abdomin pain Slight HA 7. PREGNANCY: "Is there any chance you are pregnant?" "When was your last menstrual period?"    No  Protocols used: HIGH BLOOD PRESSURE-A-AH

## 2019-04-30 NOTE — ED Notes (Signed)
Called lab to add on troponin- was told they will run it

## 2019-04-30 NOTE — Telephone Encounter (Signed)
Patient currently in ER.
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
## 2019-04-30 NOTE — Discharge Instructions (Addendum)
Make an appointment to follow-up with Dr. Caryl Bis in 1 to 2 weeks to reevaluate your blood pressure and decide if you need any change in your medication regimen.  Return to the ER for new, worsening, persistent severe abdominal pain, vomiting, fever, chest pain or difficulty breathing, or significantly elevated blood pressure readings especially higher than 200 on the top number or 120 on the bottom number.

## 2019-04-30 NOTE — Telephone Encounter (Signed)
Please see NT note from today.  Pt calling again.  States that she wants to speak with someone.  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  Nurse who triaged pt, Tedd Sias, advised that I put in a TE to the office so that they can advise pt from here.

## 2019-04-30 NOTE — ED Notes (Signed)
Pt states she thinks her abdominal pain could be related to a "stomach bug" that her husband and son have both had- her Dr sent her here d/t high bp- denies nausea and diarrhea - did have one episode of bile like vomit and states abd pain lessened afterwards

## 2019-04-30 NOTE — Telephone Encounter (Signed)
Agree with ED evaluation. 

## 2019-04-30 NOTE — ED Triage Notes (Signed)
Pt c/o generalized abd pain with N/V for the past couple days, today was seen and b/p was elevated. Takes daily meds./

## 2019-05-02 LAB — NOVEL CORONAVIRUS, NAA (HOSP ORDER, SEND-OUT TO REF LAB; TAT 18-24 HRS): SARS-CoV-2, NAA: NOT DETECTED

## 2019-05-03 ENCOUNTER — Telehealth: Payer: Self-pay | Admitting: Emergency Medicine

## 2019-05-03 NOTE — Telephone Encounter (Signed)
Patient called for covid 19 results.  Gave her negative results.

## 2019-07-21 ENCOUNTER — Other Ambulatory Visit: Payer: Self-pay | Admitting: Family Medicine

## 2019-10-22 ENCOUNTER — Other Ambulatory Visit: Payer: Self-pay | Admitting: Family Medicine

## 2019-10-29 DIAGNOSIS — Z20828 Contact with and (suspected) exposure to other viral communicable diseases: Secondary | ICD-10-CM | POA: Diagnosis not present

## 2019-10-31 ENCOUNTER — Telehealth: Payer: Self-pay | Admitting: Family Medicine

## 2019-10-31 NOTE — Telephone Encounter (Signed)
Pt called in to inform Dr. Caryl Bis that she tested positive for Covid-19 on 10/29/2019 & rapid tested on 10/30/2019.  Hula Tasso,cma

## 2019-10-31 NOTE — Telephone Encounter (Signed)
Pt called in to inform Dr. Caryl Bis that she tested positive for Covid-19 on 10/29/2019 & rapid tested on 10/30/2019.

## 2019-10-31 NOTE — Telephone Encounter (Addendum)
Noted.  Please see if she is having any symptoms.  Patient needs to quarantine at home until she meets the following criteria: It has been at least 10 days from symptom onset and she has had at least 3 days of improving symptoms and at least 3 days with no fever without fever reducing medication.  Please find out if the health department has contacted her yet.  Please find out where she tested positive.  Thanks.

## 2019-11-01 NOTE — Telephone Encounter (Signed)
Lmtcb.  To inform her of the protocol of covid and to see if  the health department had reached her.  Kathryn Henderson,cma

## 2019-11-05 NOTE — Telephone Encounter (Signed)
I called and spoke with the patient and she stated she did speak with the health department and she had all the symptoms except loss of taste and smell but she had every thing else.  She has a cough that is is improving and she is doing deep breathing exercises that has help with her OX level.  She stated the health dept told her to report to the ER if she was having any chest pain, she stated she did have chest pain but it was all muscular not any breathing pain, it was all in the muscle wall of her chest.  I did inform her if she had any other issues or symptoms please call us.  Avary Eichenberger,cma

## 2019-11-06 NOTE — Telephone Encounter (Signed)
Noted.  If she has any worsening symptoms or develops increasing chest pain or pressure or shortness of breath she needs to go to the emergency room.

## 2019-11-19 DIAGNOSIS — R509 Fever, unspecified: Secondary | ICD-10-CM | POA: Diagnosis not present

## 2019-11-19 DIAGNOSIS — U071 COVID-19: Secondary | ICD-10-CM | POA: Diagnosis not present

## 2020-01-04 ENCOUNTER — Telehealth: Payer: Self-pay

## 2020-01-04 ENCOUNTER — Telehealth: Payer: Self-pay | Admitting: Family Medicine

## 2020-01-04 NOTE — Telephone Encounter (Signed)
Error

## 2020-01-04 NOTE — Telephone Encounter (Signed)
Pt called wanting an appt for dizziness when standing after sitting for a period of time, Call was discounted before I could transfer to [facility] Called pt back and No answer

## 2020-01-04 NOTE — Telephone Encounter (Addendum)
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 Sx worsen when she is squatting or sitting then stands she becomes dizzy.  Also she gets dizzy when she turns her head very sharply to side to side.. 2 months post COVID had these sx during then, but had alot of other sx with it. Patient has no covid sx currrently. Patient has had 2nd covid vaccine this past wed. Patient had dizziness for  three weeks. Patient thinks she has ear infection wants appointment with PCP. Appointment scheduled Tuesday.

## 2020-01-08 ENCOUNTER — Ambulatory Visit (INDEPENDENT_AMBULATORY_CARE_PROVIDER_SITE_OTHER): Payer: BC Managed Care – PPO | Admitting: Family Medicine

## 2020-01-08 ENCOUNTER — Encounter: Payer: Self-pay | Admitting: Family Medicine

## 2020-01-08 ENCOUNTER — Other Ambulatory Visit: Payer: Self-pay

## 2020-01-08 VITALS — BP 120/80 | HR 90 | Temp 97.0°F | Ht 64.0 in | Wt 197.6 lb

## 2020-01-08 DIAGNOSIS — E119 Type 2 diabetes mellitus without complications: Secondary | ICD-10-CM

## 2020-01-08 DIAGNOSIS — Z8616 Personal history of COVID-19: Secondary | ICD-10-CM

## 2020-01-08 DIAGNOSIS — R42 Dizziness and giddiness: Secondary | ICD-10-CM | POA: Diagnosis not present

## 2020-01-08 DIAGNOSIS — I10 Essential (primary) hypertension: Secondary | ICD-10-CM

## 2020-01-08 DIAGNOSIS — R4189 Other symptoms and signs involving cognitive functions and awareness: Secondary | ICD-10-CM

## 2020-01-08 LAB — LIPID PANEL
Cholesterol: 152 mg/dL (ref 0–200)
HDL: 46.8 mg/dL (ref >39.00)
LDL Cholesterol: 77 mg/dL (ref 0–99)
NonHDL: 105.22
Total CHOL/HDL Ratio: 3
Triglycerides: 139 mg/dL (ref 0.0–149.0)
VLDL: 27.8 mg/dL (ref 0.0–40.0)

## 2020-01-08 LAB — HEMOGLOBIN A1C: Hgb A1c MFr Bld: 6.1 % (ref 4.6–6.5)

## 2020-01-08 LAB — COMPREHENSIVE METABOLIC PANEL
ALT: 11 U/L (ref 0–35)
AST: 12 U/L (ref 0–37)
Albumin: 4.2 g/dL (ref 3.5–5.2)
Alkaline Phosphatase: 93 U/L (ref 39–117)
BUN: 13 mg/dL (ref 6–23)
CO2: 28 mEq/L (ref 19–32)
Calcium: 9.6 mg/dL (ref 8.4–10.5)
Chloride: 100 mEq/L (ref 96–112)
Creatinine, Ser: 0.48 mg/dL (ref 0.40–1.20)
GFR: 135.04 mL/min (ref >60.00)
Glucose, Bld: 123 mg/dL — ABNORMAL HIGH (ref 70–99)
Potassium: 3.9 mEq/L (ref 3.5–5.1)
Sodium: 136 mEq/L (ref 135–145)
Total Bilirubin: 1 mg/dL (ref 0.2–1.2)
Total Protein: 7.3 g/dL (ref 6.0–8.3)

## 2020-01-08 NOTE — Assessment & Plan Note (Signed)
Patient continues to have some lethargy.  She does appear to have some cognitive issues following her COVID-19 diagnosis and given the persistence I will refer to neurology.

## 2020-01-08 NOTE — Assessment & Plan Note (Signed)
Check A1c.  Continue Metformin. 

## 2020-01-08 NOTE — Assessment & Plan Note (Signed)
Adequate control today.  Continue current regimen.  Lab work to be completed.

## 2020-01-08 NOTE — Assessment & Plan Note (Signed)
Progressively improving.  Could be related to BPPV or her right ear being stopped up with wax versus other cause.  Given progressive improvement we will see how she does with ear irrigation and monitoring her symptoms.  If they recur or do not resolve then she will let us know and we can refer to ENT.

## 2020-01-08 NOTE — Progress Notes (Signed)
Tommi Rumps, MD Phone: [phone]  Kathryn Henderson is a 54 y.o. female who presents today for follow-up.  Vertigo: Patient notes this started about 3 weeks ago.  Anytime she would rotate her head quickly she would feel a woosh and a very brief room spinning sensation.  She notes it has progressively improved.  She had somebody at work do orthostatics and those were negative.  No ear fullness.  No tinnitus.  No hearing loss.  No fevers.  Diabetes: She continues on Metformin.  She is not checking sugars.  Hypertension: Taking lisinopril and HCTZ.  No chest pain or shortness of breath.  History of COVID-19: She still notes some lethargy and she does note quite a bit of memory difficulty with lack of focal neurological symptoms since having COVID-19.  It has been over 2 months since her COVID-19 diagnosis.  Social History   Tobacco Use  Smoking Status Never Smoker  Smokeless Tobacco Never Used     ROS see history of present illness  Objective  Physical Exam Vitals:   01/08/20 1204  BP: 120/80  Pulse: 90  Temp: (!) 97 F (36.1 C)  SpO2: 98%    BP Readings from Last 3 Encounters:  01/08/20 120/80  04/30/19 (!) 163/95  10/10/18 112/80   Wt Readings from Last 3 Encounters:  01/08/20 197 lb 9.6 oz (89.6 kg)  04/30/19 200 lb (90.7 kg)  10/10/18 207 lb (93.9 kg)    Physical Exam Constitutional:      General: She is not in acute distress.    Appearance: She is not diaphoretic.  HENT:     Ears:     Comments: Cerumen noted in the right ear canal, this was irrigated by the CMA and tolerated well by the patient, right TM appeared normal.  Left TM normal, bilateral hearing intact to finger rub, negative Dix-Hallpike bilaterally Cardiovascular:     Rate and Rhythm: Normal rate and regular rhythm.     Heart sounds: Normal heart sounds.  Pulmonary:     Effort: Pulmonary effort is normal.     Breath sounds: Normal breath sounds.  Skin:    General: Skin is warm and dry.    Neurological:     Mental Status: She is alert.     Comments: EOMI, eyebrow raise intact, eye close intact, hearing intact, V1 through V3 intact bilaterally, shoulder shrug intact, 5/5 strength in bilateral biceps, triceps, grip, quads, hamstrings, plantar and dorsiflexion, sensation to light touch intact in bilateral UE and LE, normal gait, 2+ patellar reflexes      Assessment/Plan: Please see individual problem list.  Essential hypertension Adequate control today.  Continue current regimen.  Lab work to be completed.  Vertigo Progressively improving.  Could be related to BPPV or her right ear being stopped up with wax versus other cause.  Given progressive improvement we will see how she does with ear irrigation and monitoring her symptoms.  If they recur or do not resolve then she will let us know and we can refer to ENT.  History of COVID-19 Patient continues to have some lethargy.  She does appear to have some cognitive issues following her COVID-19 diagnosis and given the persistence I will refer to neurology.  Type II diabetes mellitus Check A1c.  Continue Metformin.   Orders Placed This Encounter  Procedures  . Comp Met (CMET)  . HgB A1c  . Lipid panel  . Ambulatory referral to Neurology    Referral Priority:   Routine  Referral Type:   Consultation    Referral Reason:   Specialty Services Required    Requested Specialty:   Neurology    Number of Visits Requested:   1    No orders of the defined types were placed in this encounter.   This visit occurred during the SARS-CoV-2 public health emergency.  Safety protocols were in place, including screening questions prior to the visit, additional usage of staff PPE, and extensive cleaning of exam room while observing appropriate contact time as indicated for disinfecting solutions.    Tommi Rumps, MD [place]

## 2020-01-20 ENCOUNTER — Other Ambulatory Visit: Payer: Self-pay | Admitting: Family Medicine

## 2020-01-20 DIAGNOSIS — E785 Hyperlipidemia, unspecified: Secondary | ICD-10-CM

## 2020-01-20 MED ORDER — ROSUVASTATIN CALCIUM 40 MG PO TABS: 40.0000 mg | ORAL_TABLET | Freq: Every day | ORAL | 3 refills | Status: DC

## 2020-01-26 ENCOUNTER — Other Ambulatory Visit: Payer: Self-pay | Admitting: Family Medicine

## 2020-02-18 ENCOUNTER — Encounter: Payer: BC Managed Care – PPO | Admitting: Family Medicine

## 2020-03-11 ENCOUNTER — Other Ambulatory Visit: Payer: Self-pay

## 2020-03-12 ENCOUNTER — Ambulatory Visit (INDEPENDENT_AMBULATORY_CARE_PROVIDER_SITE_OTHER): Payer: BC Managed Care – PPO | Admitting: Family Medicine

## 2020-03-12 ENCOUNTER — Encounter: Payer: Self-pay | Admitting: Family Medicine

## 2020-03-12 VITALS — BP 120/80 | HR 85 | Temp 96.5°F | Ht 64.0 in | Wt 202.2 lb

## 2020-03-12 DIAGNOSIS — Z1211 Encounter for screening for malignant neoplasm of colon: Secondary | ICD-10-CM

## 2020-03-12 DIAGNOSIS — E785 Hyperlipidemia, unspecified: Secondary | ICD-10-CM | POA: Diagnosis not present

## 2020-03-12 DIAGNOSIS — Z1231 Encounter for screening mammogram for malignant neoplasm of breast: Secondary | ICD-10-CM | POA: Diagnosis not present

## 2020-03-12 DIAGNOSIS — Z0000 Encounter for general adult medical examination without abnormal findings: Secondary | ICD-10-CM | POA: Diagnosis not present

## 2020-03-12 DIAGNOSIS — I10 Essential (primary) hypertension: Secondary | ICD-10-CM | POA: Diagnosis not present

## 2020-03-12 LAB — HEPATIC FUNCTION PANEL
ALT: 11 U/L (ref 0–35)
AST: 13 U/L (ref 0–37)
Albumin: 4.3 g/dL (ref 3.5–5.2)
Alkaline Phosphatase: 83 U/L (ref 39–117)
Bilirubin, Direct: 0.2 mg/dL (ref 0.0–0.3)
Total Bilirubin: 0.8 mg/dL (ref 0.2–1.2)
Total Protein: 7.2 g/dL (ref 6.0–8.3)

## 2020-03-12 LAB — BASIC METABOLIC PANEL
BUN: 9 mg/dL (ref 6–23)
CO2: 28 mEq/L (ref 19–32)
Calcium: 9.4 mg/dL (ref 8.4–10.5)
Chloride: 100 mEq/L (ref 96–112)
Creatinine, Ser: 0.54 mg/dL (ref 0.40–1.20)
GFR: 117.8 mL/min (ref >60.00)
Glucose, Bld: 175 mg/dL — ABNORMAL HIGH (ref 70–99)
Potassium: 3.6 mEq/L (ref 3.5–5.1)
Sodium: 136 mEq/L (ref 135–145)

## 2020-03-12 LAB — LDL CHOLESTEROL, DIRECT: Direct LDL: 46 mg/dL

## 2020-03-12 NOTE — Patient Instructions (Signed)
Nice to see you. Please call to schedule your mammogram. We will call you with your lab results. Please continue to stay active and monitor your diet.
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
## 2020-03-12 NOTE — Assessment & Plan Note (Signed)
Physical exam completed.  Encouraged healthy diet and exercise.  Referral to GI for colonoscopy.  Mammogram ordered.  Patient knows she needs to call to schedule.  Encouraged her to get the Shingrix vaccine when she is ready.  Recheck LDL and hepatic function panel.  Prior labs reviewed.

## 2020-03-12 NOTE — Addendum Note (Signed)
Addended by: Bonnell Public I on: 03/12/2020 09:12 AM   Modules accepted: Orders

## 2020-05-03 ENCOUNTER — Other Ambulatory Visit: Payer: Self-pay | Admitting: Family Medicine

## 2020-05-29 ENCOUNTER — Telehealth: Payer: Self-pay | Admitting: Family Medicine

## 2020-05-29 NOTE — Telephone Encounter (Signed)
Called and LVM for the patient informing her to call [facility] and schedule her Colonoscopy.  Melany Wiesman,cma

## 2020-05-29 NOTE — Telephone Encounter (Signed)
"  Rejection Reason - Patient did not respond" [facility] said about 22 hours ago

## 2020-05-29 NOTE — Telephone Encounter (Signed)
Please call the patient and encouraged her to contact [facility] to schedule her colonoscopy.

## 2020-06-03 NOTE — Telephone Encounter (Signed)
Called and LVM for the patient to call back to informed her to call  GI to schedule her colonoscopy.  Trenia Tennyson,cma

## 2020-06-17 ENCOUNTER — Encounter: Payer: Self-pay | Admitting: Family Medicine

## 2020-06-17 NOTE — Telephone Encounter (Signed)
I called and spoke to the patient and she stated she would call and schedule the colonoscopy later. Marialuisa Basara,cma

## 2020-08-11 ENCOUNTER — Other Ambulatory Visit: Payer: Self-pay | Admitting: Family Medicine

## 2020-09-12 ENCOUNTER — Other Ambulatory Visit: Payer: Self-pay

## 2020-09-15 ENCOUNTER — Encounter: Payer: Self-pay | Admitting: Family Medicine

## 2020-09-15 ENCOUNTER — Ambulatory Visit: Payer: BC Managed Care – PPO | Admitting: Family Medicine

## 2020-09-15 ENCOUNTER — Other Ambulatory Visit: Payer: Self-pay

## 2020-09-15 VITALS — BP 120/80 | HR 89 | Temp 99.0°F | Ht 64.0 in | Wt 213.8 lb

## 2020-09-15 DIAGNOSIS — R519 Headache, unspecified: Secondary | ICD-10-CM

## 2020-09-15 DIAGNOSIS — Z8616 Personal history of COVID-19: Secondary | ICD-10-CM

## 2020-09-15 DIAGNOSIS — K219 Gastro-esophageal reflux disease without esophagitis: Secondary | ICD-10-CM

## 2020-09-15 DIAGNOSIS — E119 Type 2 diabetes mellitus without complications: Secondary | ICD-10-CM | POA: Diagnosis not present

## 2020-09-15 DIAGNOSIS — I10 Essential (primary) hypertension: Secondary | ICD-10-CM | POA: Diagnosis not present

## 2020-09-15 DIAGNOSIS — G43109 Migraine with aura, not intractable, without status migrainosus: Secondary | ICD-10-CM | POA: Diagnosis not present

## 2020-09-15 LAB — LDL CHOLESTEROL, DIRECT: Direct LDL: 51 mg/dL

## 2020-09-15 LAB — BASIC METABOLIC PANEL
BUN: 14 mg/dL (ref 6–23)
CO2: 29 mEq/L (ref 19–32)
Calcium: 9.4 mg/dL (ref 8.4–10.5)
Chloride: 99 mEq/L (ref 96–112)
Creatinine, Ser: 0.63 mg/dL (ref 0.40–1.20)
GFR: 100.74 mL/min (ref >60.00)
Glucose, Bld: 209 mg/dL — ABNORMAL HIGH (ref 70–99)
Potassium: 4.5 mEq/L (ref 3.5–5.1)
Sodium: 136 mEq/L (ref 135–145)

## 2020-09-15 LAB — HEMOGLOBIN A1C: Hgb A1c MFr Bld: 8.5 % — ABNORMAL HIGH (ref 4.6–6.5)

## 2020-09-15 MED ORDER — NORTRIPTYLINE HCL 10 MG PO CAPS: 10.0000 mg | ORAL_CAPSULE | Freq: Every day | ORAL | 1 refills | Status: DC

## 2020-09-15 NOTE — Assessment & Plan Note (Signed)
Patient with increased symptoms recently.  She will try omeprazole 20 mg twice daily prior to meals for 2 weeks to see if that provides benefit.  If she has recurrence of symptoms after completing that course she can take the omeprazole 20 mg once daily before breakfast on an ongoing basis.

## 2020-09-15 NOTE — Progress Notes (Signed)
Marikay Alar, MD Phone: [phone]  Kathryn Henderson is a 54 y.o. female who presents today for follow-up.  Hypertension: Typically 124/76.  Taking HCTZ and lisinopril.  No chest pain or shortness of breath.  Occasionally feels like she has to take a big gasp of air but notes that started after having Covid about a year ago.  Rare minimal edema at the end of the day.  Diabetes: Not checking sugars.  She has taken Metformin.  No polyuria or polydipsia.  No hypoglycemia.  Tension headaches: Patient notes headache started after having Covid.  Feels as though there is a band across the back of her head and occasionally moves into the front of her head.  Notes it is there most of the time.  No vision changes, numbness, or weakness.  She has tried Excedrin Migraine and notes sometimes it is beneficial other times its not.  She does note she changed jobs in March and that has been more stressful.  Migraines: Patient notes her migraines have been less frequent over the last year.  Notes having 1 migraine in the last 8 to 9 months.  GERD: This has been an ongoing issue for about 20 years.  She takes omeprazole on an as-needed basis 4-5 times a week.  Feels as though her reflux has gotten slightly worse recently.  No abdominal pain, blood in her stool, or dysphagia.  She does report a history of an EGD in the past that she reports was benign.  History of Covid: Patient does note typically in the early afternoon she has a lack of focus.  Notes that started after having had Covid.  She notes its been relatively stable for some time now.  Social History   Tobacco Use  Smoking Status Never Smoker  Smokeless Tobacco Never Used     ROS see history of present illness  Objective  Physical Exam Vitals:   09/15/20 0846  BP: 120/80  Pulse: 89  Temp: 99 F (37.2 C)  SpO2: 98%    BP Readings from Last 3 Encounters:  09/15/20 120/80  03/12/20 120/80  01/08/20 120/80   Wt Readings from Last  3 Encounters:  09/15/20 213 lb 12.8 oz (97 kg)  03/12/20 202 lb 3.2 oz (91.7 kg)  01/08/20 197 lb 9.6 oz (89.6 kg)    Physical Exam Constitutional:      General: She is not in acute distress.    Appearance: She is not diaphoretic.  Cardiovascular:     Rate and Rhythm: Normal rate and regular rhythm.     Heart sounds: Normal heart sounds.  Pulmonary:     Effort: Pulmonary effort is normal.     Breath sounds: Normal breath sounds.  Abdominal:     General: Bowel sounds are normal. There is no distension.     Palpations: Abdomen is soft.     Tenderness: There is no abdominal tenderness. There is no guarding or rebound.  Musculoskeletal:     Right lower leg: No edema.     Left lower leg: No edema.  Skin:    General: Skin is warm and dry.  Neurological:     Mental Status: She is alert.     Comments: EOMI, PERRL, facial sensation intact in V1 through V3 bilaterally, hearing intact bilaterally, shoulder shrug intact, 5/5 strength in bilateral biceps, triceps, grip, quads, hamstrings, plantar and dorsiflexion, sensation to light touch intact in bilateral UE and LE, normal gait       Assessment/Plan: Please see  individual problem list.  Problem List Items Addressed This Visit    Essential hypertension - Primary    Well-controlled.  She will continue HCTZ 25 mg once daily and lisinopril 10 mg once daily.  Check BMP.      Relevant Orders   Basic Metabolic Panel (BMET)   Gastroesophageal reflux disease without esophagitis    Patient with increased symptoms recently.  She will try omeprazole 20 mg twice daily prior to meals for 2 weeks to see if that provides benefit.  If she has recurrence of symptoms after completing that course she can take the omeprazole 20 mg once daily before breakfast on an ongoing basis.      History of COVID-19    Patient with some possible persistent brain fog after having had Covid about a year ago.  We will see how she does with the medication addition  for her headaches and if the brain fog is not improving we could refer her for evaluation of the post Covid clinic.      Migraine    Much improved frequency.  She can continue Maxalt as needed.      Relevant Medications   nortriptyline (PAMELOR) 10 MG capsule   New onset of headaches after age 17    New onset headaches.  They could be tension headaches related to stress from her job or persistent headaches following COVID-19.  Discussed given her age I would recommend an MRI to evaluate for underlying lesion that could be contributing.  She is concerned about the cost of this given her husband significant medical issues after having had Covid and thus defers imaging at this time.  I provided her with the type of imaging and the diagnosis code that we would use so she can check with her insurance regarding cost.  She will let us know if she would like to proceed with imaging in the future.  We will trial a low-dose of nortriptyline to see if that helps with her headaches.  Discussed risk of drowsiness, dry mouth, constipation, and confusion.  If she develops those side effects she will let us know right away.  She will follow-up in 6 weeks.      Relevant Medications   nortriptyline (PAMELOR) 10 MG capsule   Type II diabetes mellitus (HCC)    Undetermined control.  Continue Metformin 500 mg twice daily.  Check A1c and LDL.      Relevant Orders   Direct LDL   HgB A1c      This visit occurred during the SARS-CoV-2 public health emergency.  Safety protocols were in place, including screening questions prior to the visit, additional usage of staff PPE, and extensive cleaning of exam room while observing appropriate contact time as indicated for disinfecting solutions.   I have spent 42 minutes in the care of this patient regarding history taking, documentation, physical exam, placing orders, discussion of work-up and treatment of new onset headaches, discussion of plan.   Marikay Alar,  MD [facility]

## 2020-09-15 NOTE — Assessment & Plan Note (Addendum)
New onset headaches.  They could be tension headaches related to stress from her job or persistent headaches following COVID-19.  Discussed given her age I would recommend an MRI to evaluate for underlying lesion that could be contributing.  She is concerned about the cost of this given her husband significant medical issues after having had Covid and thus defers imaging at this time.  I provided her with the type of imaging and the diagnosis code that we would use so she can check with her insurance regarding cost.  She will let us know if she would like to proceed with imaging in the future.  We will trial a low-dose of nortriptyline to see if that helps with her headaches.  Discussed risk of drowsiness, dry mouth, constipation, and confusion.  If she develops those side effects she will let us know right away.  She will follow-up in 6 weeks.

## 2020-09-15 NOTE — Assessment & Plan Note (Signed)
Well-controlled.  She will continue HCTZ 25 mg once daily and lisinopril 10 mg once daily.  Check BMP.

## 2020-09-15 NOTE — Assessment & Plan Note (Signed)
Patient with some possible persistent brain fog after having had Covid about a year ago.  We will see how she does with the medication addition for her headaches and if the brain fog is not improving we could refer her for evaluation of the post Covid clinic.

## 2020-09-15 NOTE — Assessment & Plan Note (Signed)
Undetermined control.  Continue Metformin 500 mg twice daily.  Check A1c and LDL.

## 2020-09-15 NOTE — Assessment & Plan Note (Signed)
Much improved frequency.  She can continue Maxalt as needed.

## 2020-09-15 NOTE — Patient Instructions (Signed)
Nice to see you. Please check with your insurance regarding MRI brain without contrast for a diagnosis of new onset headaches after age 54.  The diagnosis code is R 51.9.  If you would like to proceed with this please let us know. We will start you on nortriptyline to see if that helps with your headaches. We will increase your omeprazole to twice daily dosing for 2 weeks to see if that helps with your reflux. We will get labs today and contact you with the results.

## 2020-09-29 ENCOUNTER — Telehealth: Payer: Self-pay

## 2020-09-29 NOTE — Telephone Encounter (Signed)
-----   Message from Eric G Sonnenberg, MD sent at 09/18/2020 11:37 AM EST ----- Please let the patient know her A1c is worse than it was previously.  Is now 8.5.  Her blood glucose was elevated as well.  I would suggest increasing her Metformin to 1000 mg twice daily and adding on Jardiance to help lower her A1c and blood sugars.  Her other labs are acceptable. 

## 2020-10-01 ENCOUNTER — Telehealth: Payer: Self-pay

## 2020-10-01 NOTE — Telephone Encounter (Signed)
-----   Message from Glori Luis, MD sent at 09/18/2020 11:37 AM EST ----- Please let the patient know her A1c is worse than it was previously.  Is now 8.5.  Her blood glucose was elevated as well.  I would suggest increasing her Metformin to 1000 mg twice daily and adding on Jardiance to help lower her A1c and blood sugars.  Her other labs are acceptable.

## 2020-10-08 ENCOUNTER — Other Ambulatory Visit: Payer: Self-pay | Admitting: Family Medicine

## 2020-10-08 MED ORDER — EMPAGLIFLOZIN 10 MG PO TABS: 10.0000 mg | ORAL_TABLET | Freq: Every day | ORAL | 2 refills | Status: DC

## 2020-10-08 MED ORDER — METFORMIN HCL 500 MG PO TABS: 1000.0000 mg | ORAL_TABLET | Freq: Two times a day (BID) | ORAL | 1 refills | Status: DC

## 2020-10-10 ENCOUNTER — Telehealth: Payer: Self-pay

## 2020-10-10 NOTE — Telephone Encounter (Signed)
Pt said insurance will not cover Jardiance. It will be $600 a month. She wants to know if there is an alternative medication and a call back on what she needs to do.

## 2020-10-12 IMAGING — CT CT ABDOMEN AND PELVIS WITH CONTRAST
2 of 5 series · 17 of 46 positions shown, 19 images · IV contrast (APPLIED)
Comparison: None.

CLINICAL DATA: Right upper abdominal pain, vomiting

EXAM:
CT ABDOMEN AND PELVIS WITH CONTRAST
TECHNIQUE: Multidetector CT imaging of the abdomen and pelvis was performed
using the standard protocol following bolus administration of
intravenous contrast.
CONTRAST:  100mL OMNIPAQUE IOHEXOL 300 MG/ML  SOLN

[Series 2: routine abd/pel with · axial · 0.86mm/px · z∈[-476,-36]mm · 14 of 100 slices shown, 16 images]
[im 6/100  soft-tissue]
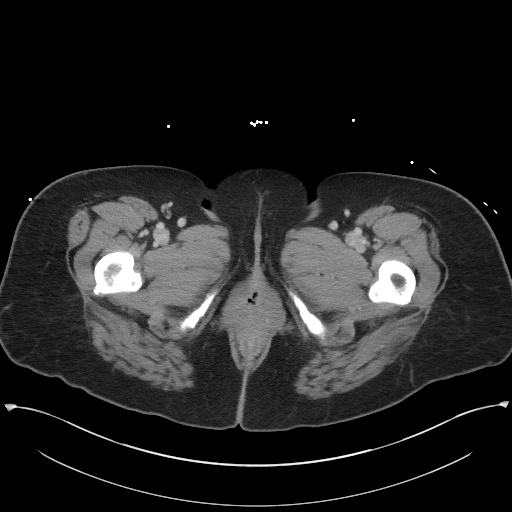
[im 6/100  bone]
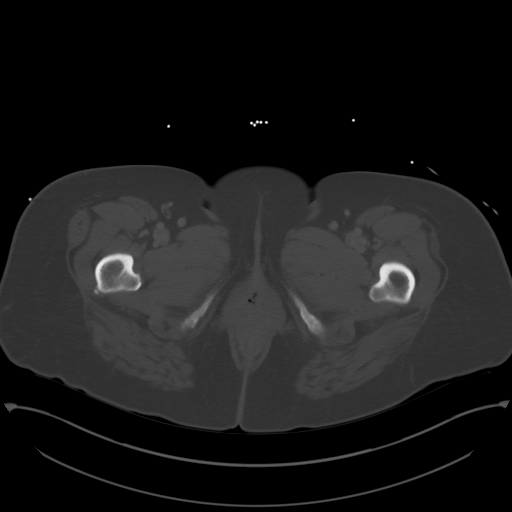
[im 12/100  soft-tissue]
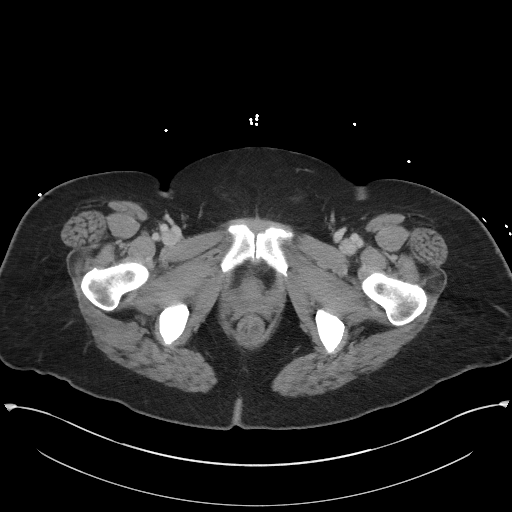
[im 18/100  soft-tissue]
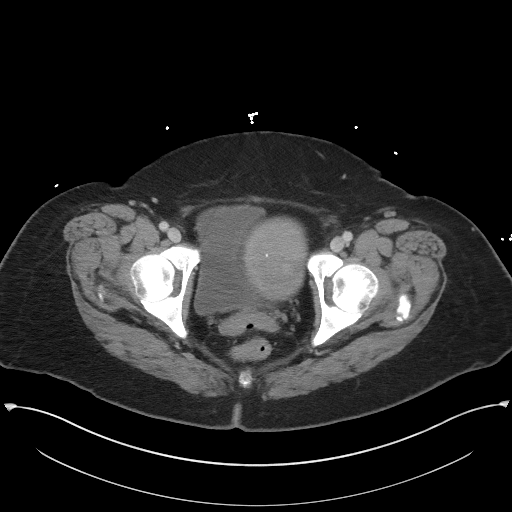
[im 30/100  soft-tissue]
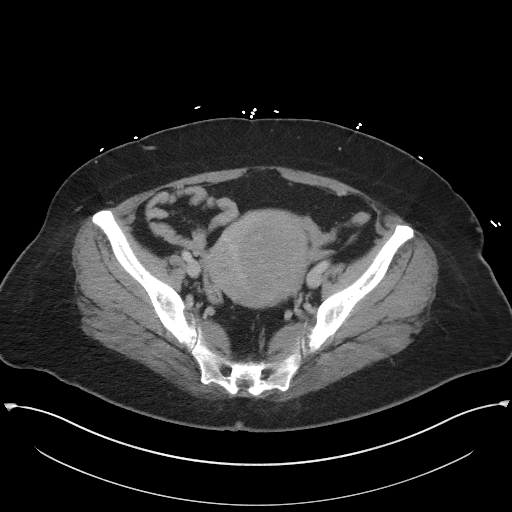
[im 35/100  soft-tissue]
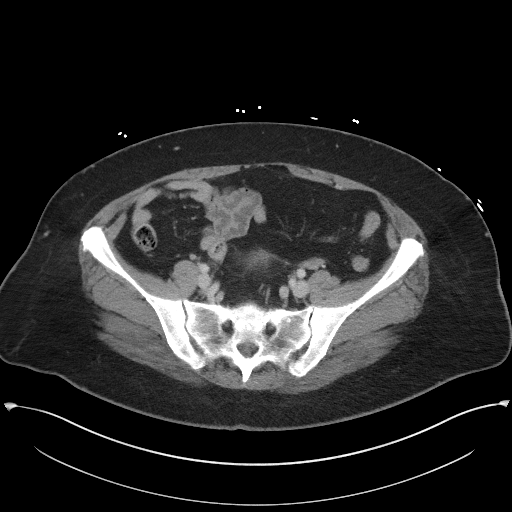
[im 41/100  soft-tissue]
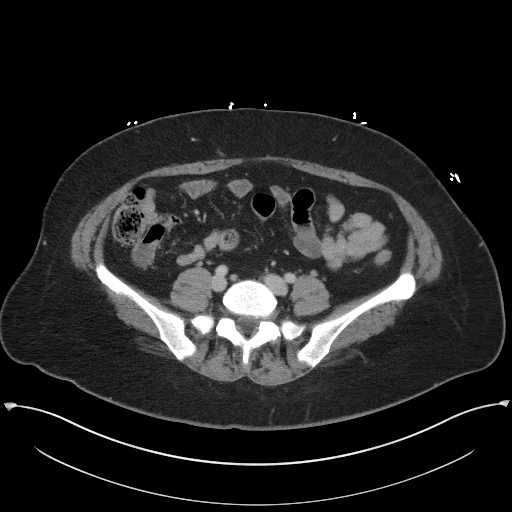
[im 47/100  soft-tissue]
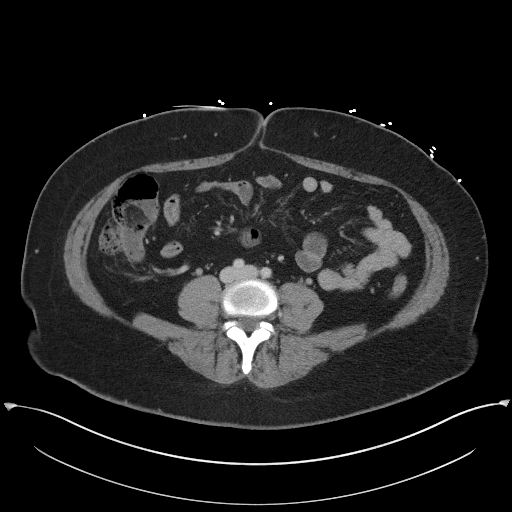
[im 53/100  soft-tissue]
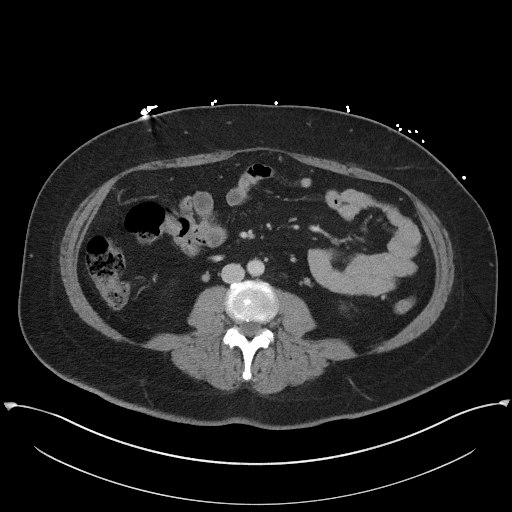
[im 59/100  soft-tissue]
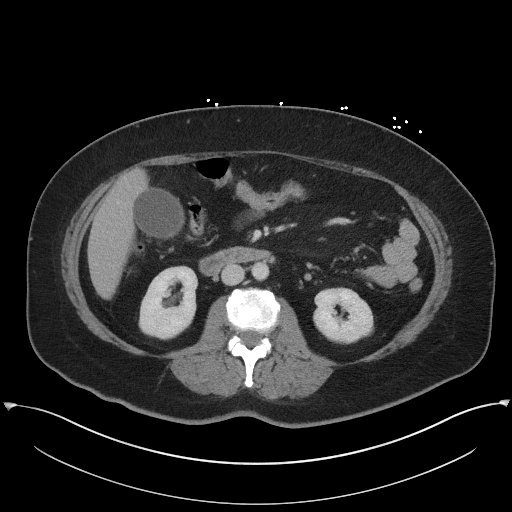
[im 59/100  bone]
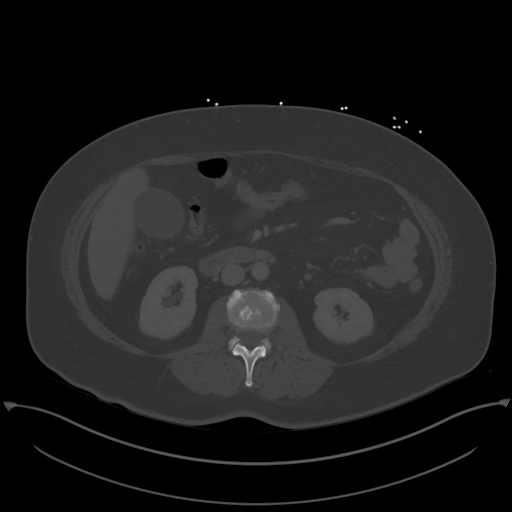
[im 65/100  soft-tissue]
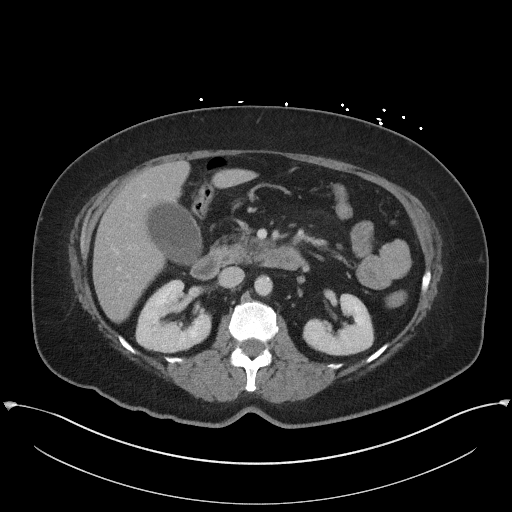
[im 76/100  soft-tissue]
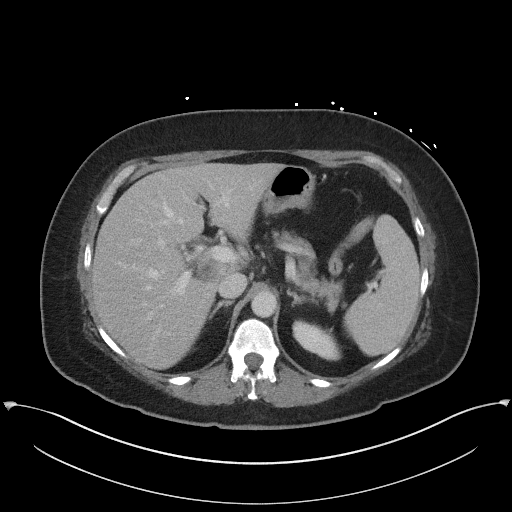
[im 82/100  soft-tissue]
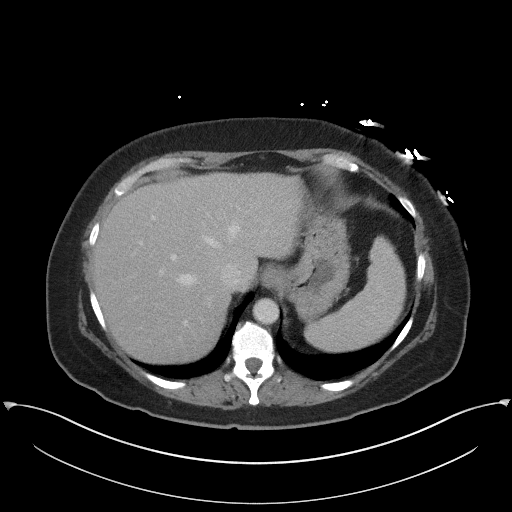
[im 88/100  soft-tissue]
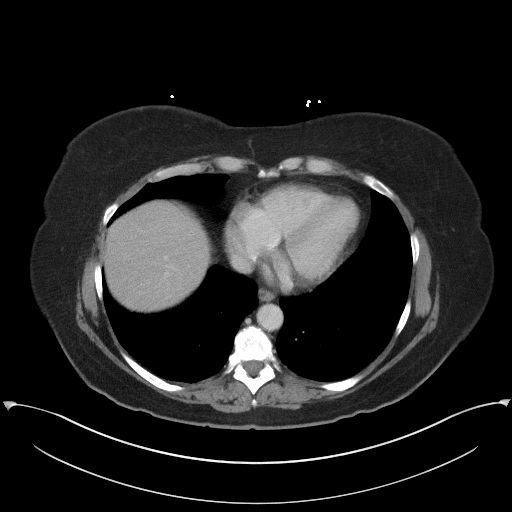
[im 94/100  soft-tissue]
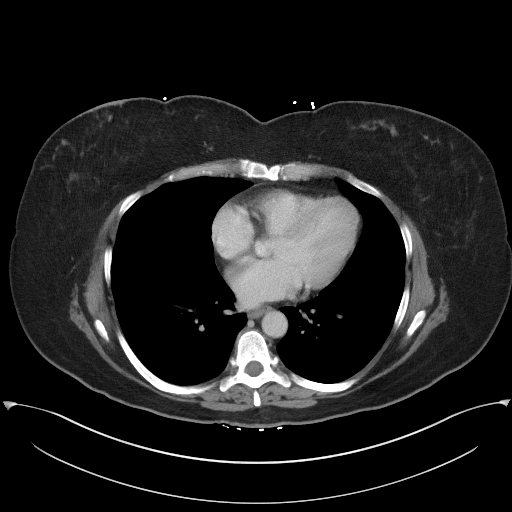

[Series 5: coronal st · coronal · 0.89mm/px · 3 of 92 slices shown]
[im 31/92  soft-tissue]
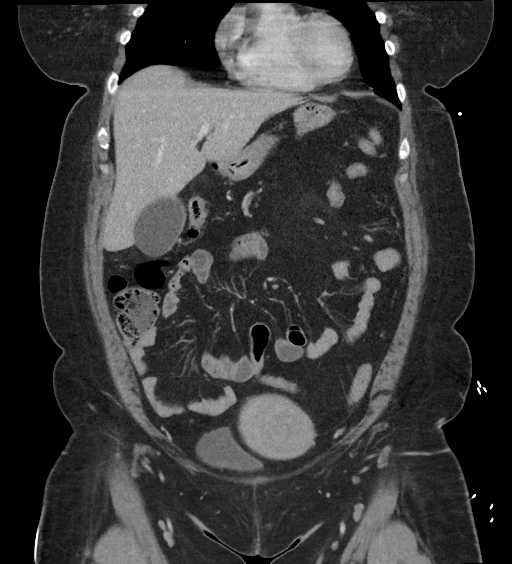
[im 41/92  soft-tissue]
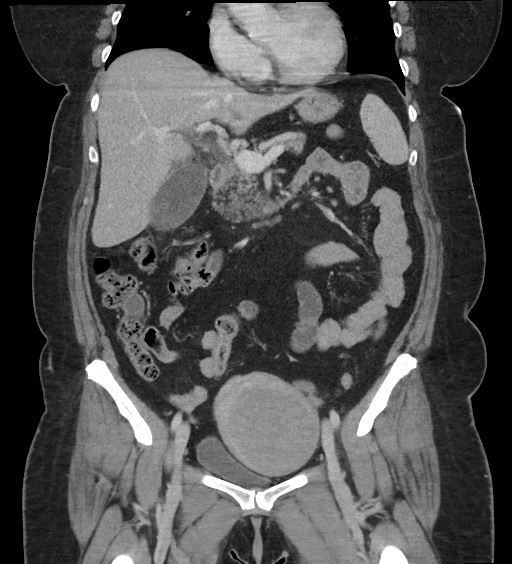
[im 51/92  soft-tissue]
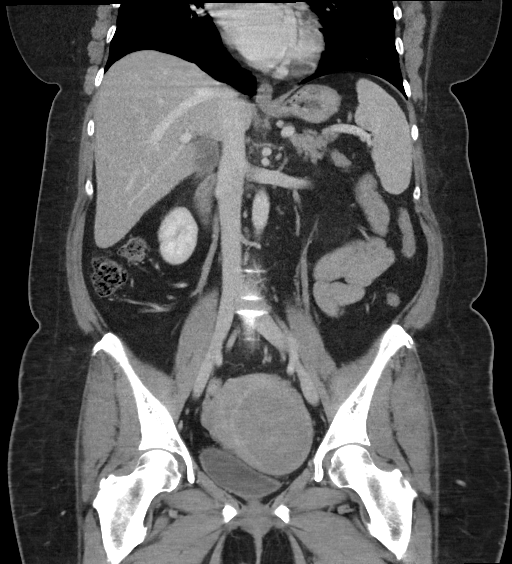

[17 of 46 positions shown; findings below may reference images not displayed]

FINDINGS: Lower chest: No acute abnormality.

Hepatobiliary: No solid liver abnormality is seen. Multiple
gallstones in the gallbladder. No gallbladder wall thickening, or
biliary dilatation.

Pancreas: Unremarkable. No pancreatic ductal dilatation or
surrounding inflammatory changes.

Spleen: Normal in size without significant abnormality.

Adrenals/Urinary Tract: Adrenal glands are unremarkable. Kidneys are
normal, without renal calculi, solid lesion, or hydronephrosis.
Bladder is unremarkable.

Stomach/Bowel: Stomach is within normal limits. No evidence of bowel
wall thickening, distention, or inflammatory changes.

Vascular/Lymphatic: No significant vascular findings are present. No
enlarged abdominal or pelvic lymph nodes.

Reproductive: Bulky uterine fibroids.

Other: No abdominal wall hernia or abnormality. No abdominopelvic
ascites.

Musculoskeletal: No acute or significant osseous findings.
IMPRESSION: 1.  Gallstones.  No CT evidence of acute cholecystitis.

2.  No acute CT findings of the abdomen or pelvis to explain pain.

3.  Uterine fibroids.

## 2020-10-12 IMAGING — US ULTRASOUND ABDOMEN LIMITED
1 series · 14 of 25 positions shown · non-contrast
Comparison: None.

CLINICAL DATA: RIGHT upper quadrant pain.

EXAM:
ULTRASOUND ABDOMEN LIMITED RIGHT UPPER QUADRANT

[Series 1: ultrasound abdomen limited · 0.25mm/px · 14 of 76 slices shown]
[im 1/76]
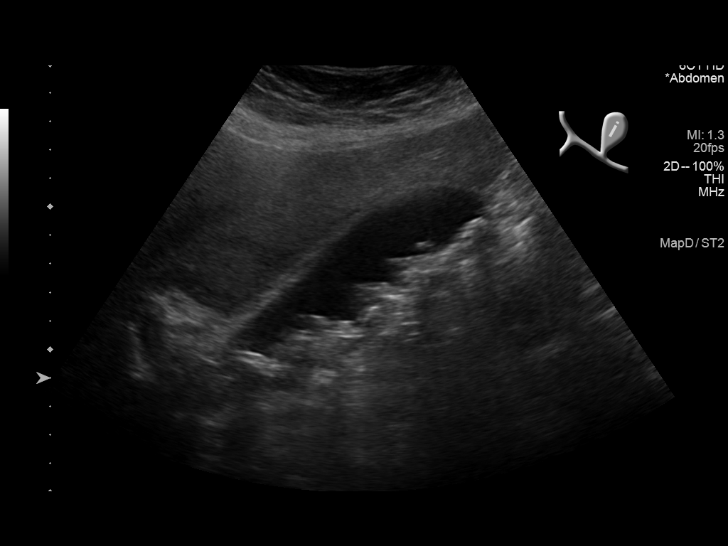
[im 7/76]
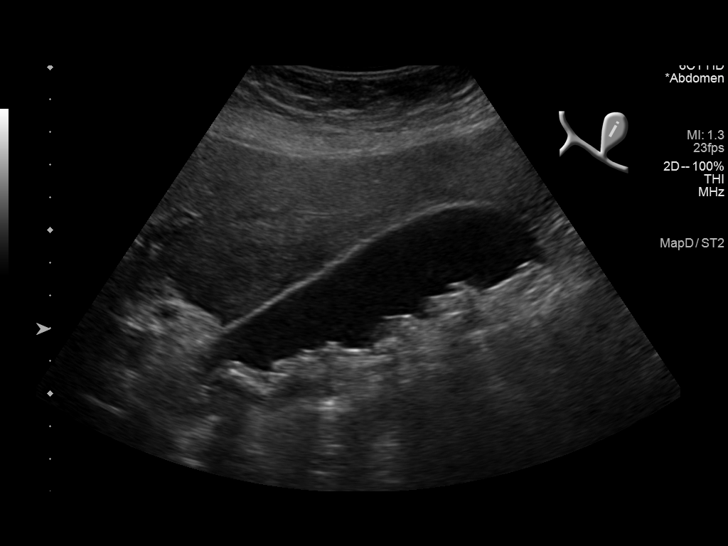
[im 13/76]
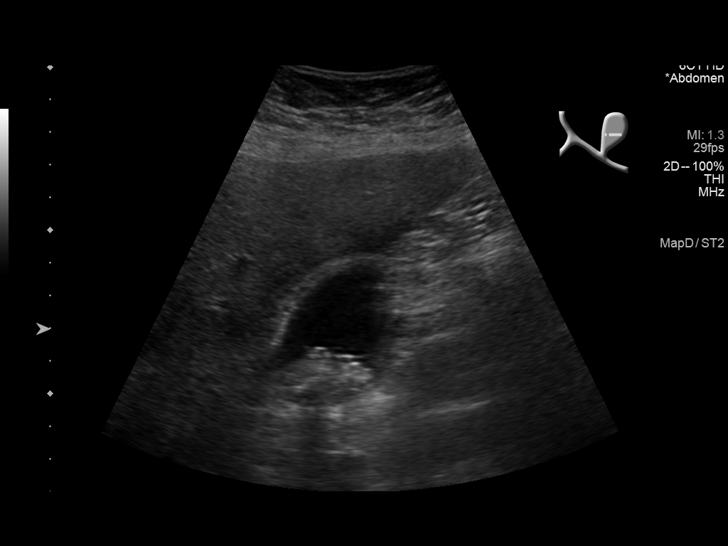
[im 19/76]
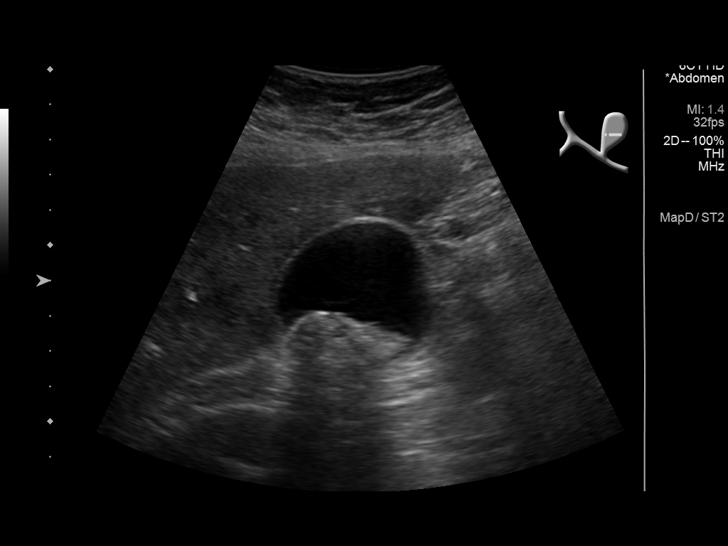
[im 26/76]
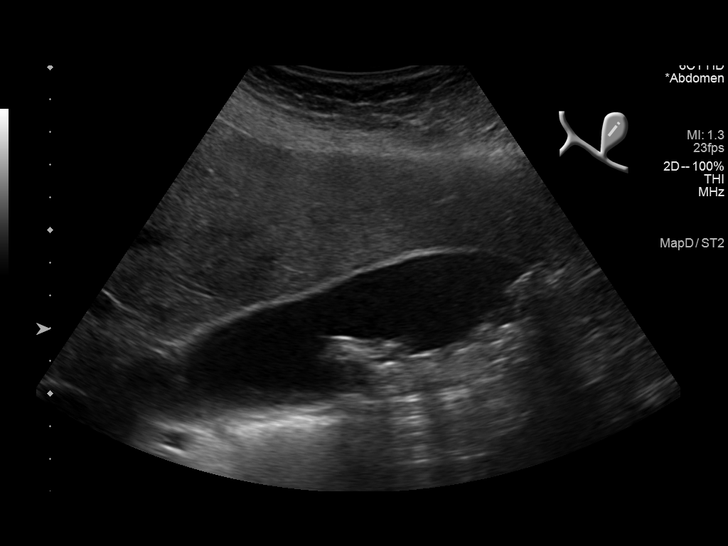
[im 29/76]
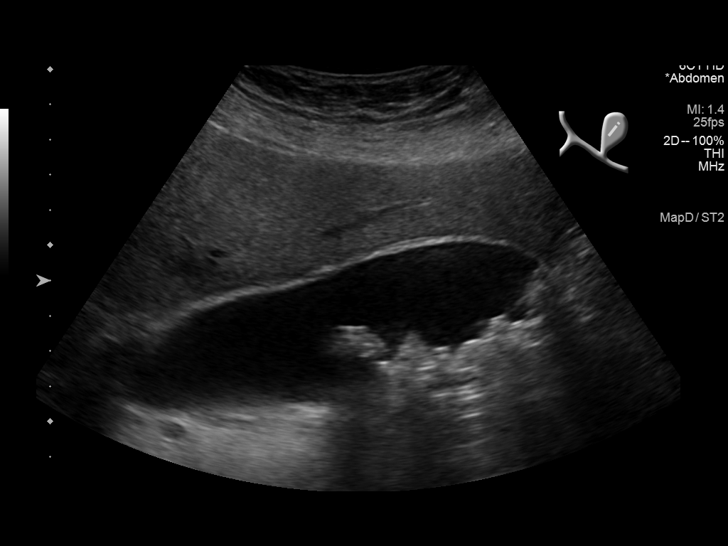
[im 35/76]
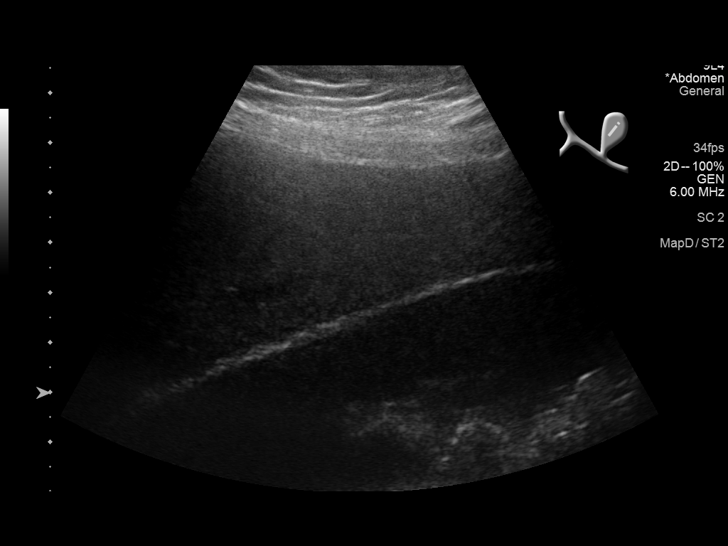
[im 41/76]
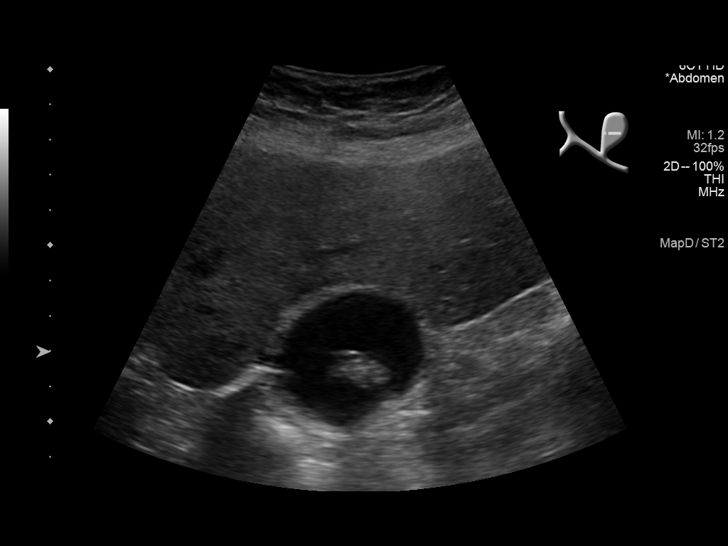
[im 47/76]
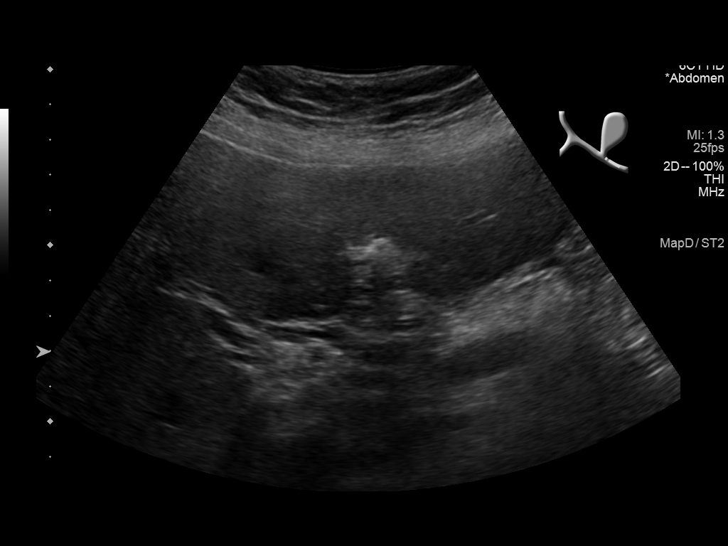
[im 51/76]
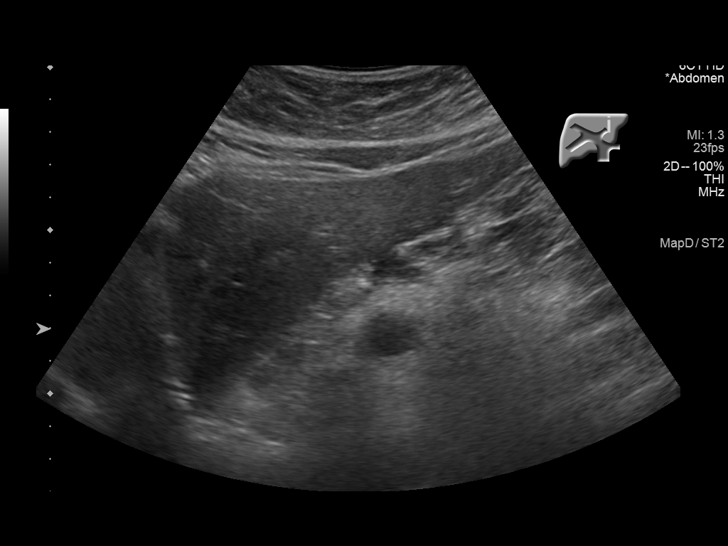
[im 57/76]
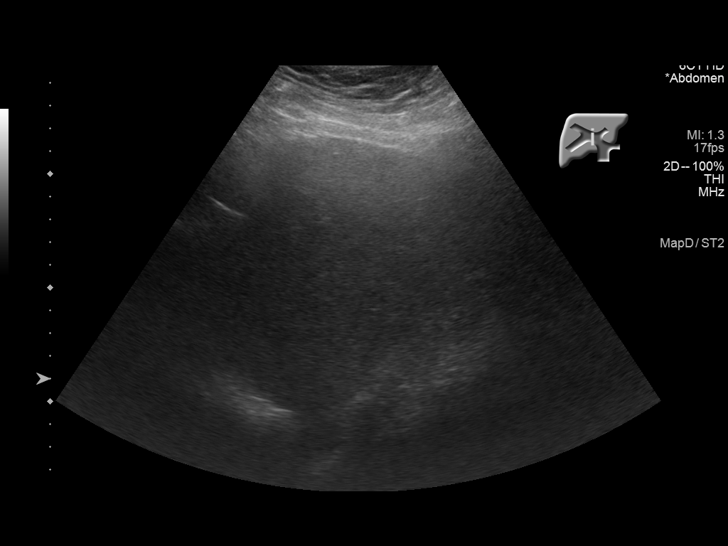
[im 63/76]
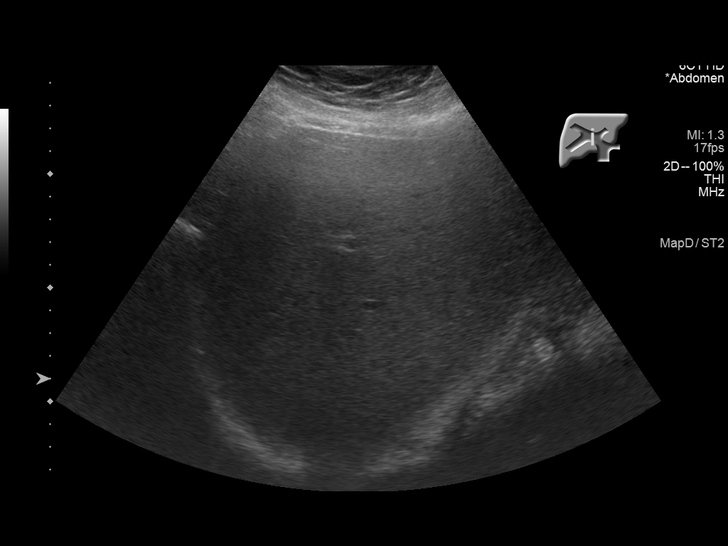
[im 69/76]
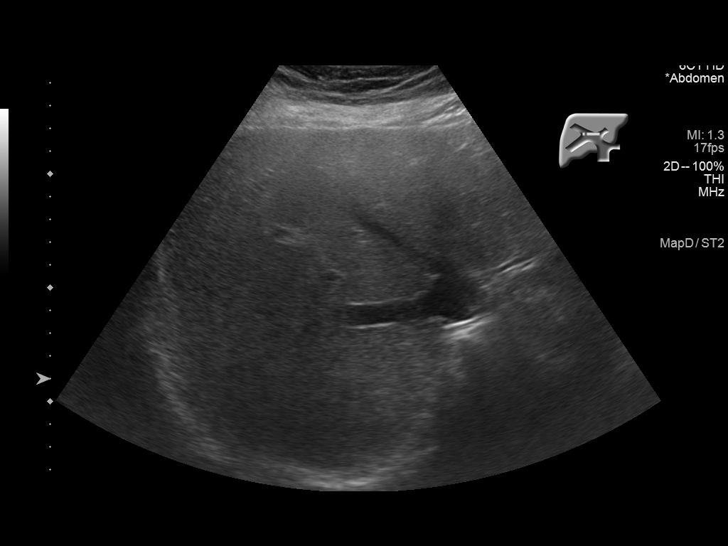
[im 76/76]
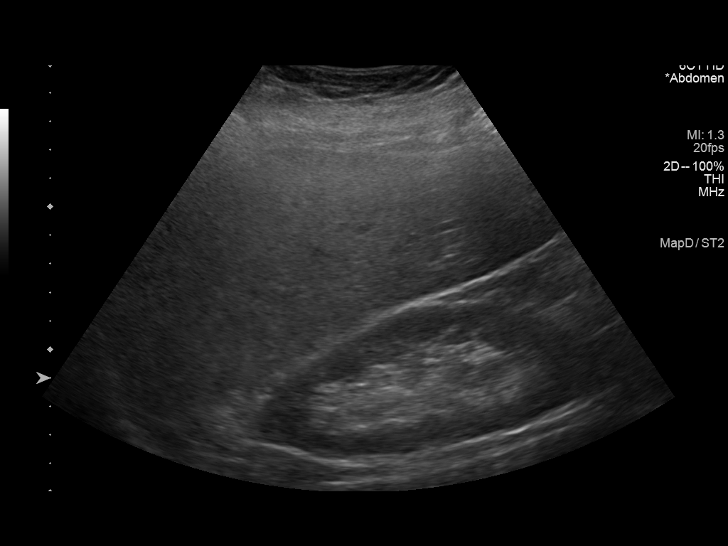

[14 of 25 positions shown; findings below may reference images not displayed]

FINDINGS: Gallbladder:

Multiple gallstones fill the lumen of the gallbladder. No
gallbladder wall thickening or pericholecystic fluid. Negative
sonographic Murphy's sign.

Common bile duct:

Diameter: Within normal limits of 4 mm

Liver:

No focal lesion identified. Within normal limits in parenchymal
echogenicity. Portal vein is patent on color Doppler imaging with
normal direction of blood flow towards the liver.
IMPRESSION: Cholelithiasis without evidence of cholecystitis.

## 2020-10-13 ENCOUNTER — Telehealth: Payer: Self-pay

## 2020-10-13 NOTE — Telephone Encounter (Signed)
Pt said insurance will not cover Jardiance. It will be $600 a month. She wants to know if there is an alternative medication and a call back on what she needs to do. A PA has been started I am awaiting the response.  Nori Poland,cma

## 2020-10-13 NOTE — Telephone Encounter (Signed)
Let's see what the PA says. It may give [place] some alternatives that we can try or it may allow for a cost reduction. Please let the patient know this and let me know once we have the result of the PA. Thanks.

## 2020-10-16 MED ORDER — DAPAGLIFLOZIN PROPANEDIOL 5 MG PO TABS: 5.0000 mg | ORAL_TABLET | Freq: Every day | ORAL | 1 refills | Status: DC

## 2020-10-16 NOTE — Telephone Encounter (Signed)
They will cover farxiga, though it will need a PA. I sent this to the pharmacy. Please be on the look out for a PA. Please let the patient know this and that the farxiga is a similar medication to the jardiance.

## 2020-10-21 ENCOUNTER — Encounter: Payer: Self-pay | Admitting: Family Medicine

## 2020-10-21 NOTE — Telephone Encounter (Signed)
PA done on Farxiga and awaiting a response.  Montae Stager,cma

## 2020-10-27 ENCOUNTER — Telehealth (INDEPENDENT_AMBULATORY_CARE_PROVIDER_SITE_OTHER): Payer: BC Managed Care – PPO | Admitting: Family Medicine

## 2020-10-27 ENCOUNTER — Telehealth: Payer: Self-pay

## 2020-10-27 ENCOUNTER — Other Ambulatory Visit: Payer: Self-pay

## 2020-10-27 DIAGNOSIS — E119 Type 2 diabetes mellitus without complications: Secondary | ICD-10-CM | POA: Diagnosis not present

## 2020-10-27 DIAGNOSIS — Z8616 Personal history of COVID-19: Secondary | ICD-10-CM

## 2020-10-27 DIAGNOSIS — R519 Headache, unspecified: Secondary | ICD-10-CM | POA: Diagnosis not present

## 2020-10-27 DIAGNOSIS — K219 Gastro-esophageal reflux disease without esophagitis: Secondary | ICD-10-CM

## 2020-10-27 DIAGNOSIS — Z20822 Contact with and (suspected) exposure to covid-19: Secondary | ICD-10-CM

## 2020-10-27 MED ORDER — NORTRIPTYLINE HCL 10 MG PO CAPS: 10.0000 mg | ORAL_CAPSULE | Freq: Every day | ORAL | 2 refills | Status: DC

## 2020-10-27 NOTE — Assessment & Plan Note (Signed)
Brain fog has been improving.  I did discuss referral to a neurologist given the persistence of this issue though she declines this at this time.  She will let us know if she changes her mind.

## 2020-10-27 NOTE — Assessment & Plan Note (Addendum)
Much improved.  Possibly related to post Covid issues though I did recommend MRI brain to evaluate for an intracranial process given her age.  She wants to check with her husband and then also with her insurance regarding coverage for this.  She will let us know if she would like to proceed with the MRI.  She will continue nortriptyline 10 mg once daily as this has been beneficial.

## 2020-10-27 NOTE — Assessment & Plan Note (Signed)
Improved.  Continue omeprazole 20 mg once daily.  Discussed avoiding trigger foods.

## 2020-10-27 NOTE — Assessment & Plan Note (Signed)
The patient will continue Metformin 1000 mg twice daily.  She will continue with diet and exercise changes.  We were unable to get an SGLT2 inhibitor approved.  I will review the paperwork that the insurance company sent though at this time we will see how the patient does with the above changes.  Plan for follow-up in 2 months.

## 2020-10-27 NOTE — Progress Notes (Signed)
Virtual Visit via telephone Note  This visit type was conducted due to national recommendations for restrictions regarding the COVID-19 pandemic (e.g. social distancing).  This format is felt to be most appropriate for this patient at this time.  All issues noted in this document were discussed and addressed.  No physical exam was performed (except for noted visual exam findings with Video Visits).   I connected with Kathryn Henderson today at  2:45 PM EST by telephone and verified that I am speaking with the correct person using two identifiers. Location patient: home Location provider: home office Persons participating in the virtual visit: patient, provider  I discussed the limitations, risks, security and privacy concerns of performing an evaluation and management service by telephone and the availability of in person appointments. I also discussed with the patient that there may be a patient responsible charge related to this service. The patient expressed understanding and agreed to proceed.  Interactive audio and video telecommunications were attempted between this provider and patient, however failed, due to patient having technical difficulties OR patient did not have access to video capability.  We continued and completed visit with audio only.   Reason for visit: f/u  HPI: Diabetes: Patient has not been checking her glucose as her glucometer is broken.  She is working on getting this replaced.  She has been taking Metformin 1000 mg twice daily.  She has improved her liquid intake and is no longer drinking as much sweet tea.  She has also increased her exercise.  Her insurance company did not approve farxiga.  GERD: Patient notes she took omeprazole twice daily for 2 weeks and then has been taking it daily since then.  Her reflux has significantly improved.  She has had one episode of reflux since her last visit and it was very mild.  No abdominal pain.  No blood in her stool.  No  dysphagia.  Chronic headaches: She notes the nortriptyline has been very beneficial.  She has only had 1 headache that lasted about 20 minutes since starting on the medication.  She did not check with her insurance regarding the MRI.  She has not noticed any side effects from the nortriptyline.  Brain fog: She notes this is somewhat better.  It is not as severe and does not last quite as long.  Typically occurs a certain number of hours into her workday and then fades within 1 to 2 hours.  COVID-19 exposure.  The patient is fully vaccinated.  Her son tested positive for Covid recently.  He has mild symptoms.  She notes she is going to get testing tomorrow or the next day given the guidelines that were given to her.  She has not had any symptoms.   ROS: See pertinent positives and negatives per HPI.  Past Medical History:  Diagnosis Date  . Allergy   . Diabetes mellitus without complication (HCC)   . GERD (gastroesophageal reflux disease)   . History of chicken pox   . History of nerve impingement    right elbow  . Hypertension   . Migraine   . Migraines   . [facility] spotted [facility] 2004  . Ulcer     Past Surgical History:  Procedure Laterality Date  . BREAST BIOPSY Left 06/10/2014   neg  . cryo ablasion  2010  . CYST REMOVAL HAND Left 04/1977    Family History  Problem Relation Age of Onset  . Arthritis Mother   . Hypertension Mother   .  Arthritis Father   . Cancer Father        lung  . Hyperlipidemia Father   . Heart disease Father   . Hypertension Father   . Hypertension Maternal Grandmother   . Diabetes Maternal Grandmother   . Cancer Paternal Grandmother        breast  . Diabetes Paternal Grandmother   . Breast cancer Paternal Grandmother 50       bilateral    SOCIAL HX: Non-smoker   Current Outpatient Medications:  .  aspirin-acetaminophen-caffeine (EXCEDRIN MIGRAINE) 250-250-65 MG per tablet, Take by mouth every 6 (six) hours as needed for  headache., Disp: , Rfl:  .  cetirizine (ZYRTEC) 10 MG tablet, Take 10 mg by mouth daily., Disp: , Rfl:  .  fluticasone (FLONASE) 50 MCG/ACT nasal spray, Place 1 spray into both nostrils daily., Disp: , Rfl:  .  hydrochlorothiazide (HYDRODIURIL) 25 MG tablet, TAKE ONE TABLET BY MOUTH EVERY DAY, Disp: 90 tablet, Rfl: 0 .  ibuprofen (ADVIL,MOTRIN) 200 MG tablet, Take 200 mg by mouth every 6 (six) hours as needed., Disp: , Rfl:  .  lisinopril (ZESTRIL) 10 MG tablet, TAKE ONE TABLET EVERY DAY NEEDS APPT FORMORE REFILLS, Disp: 90 tablet, Rfl: 1 .  metFORMIN (GLUCOPHAGE) 500 MG tablet, Take 2 tablets (1,000 mg total) by mouth 2 (two) times daily with a meal., Disp: 360 tablet, Rfl: 1 .  Multiple Vitamins-Minerals (MULTIVITAMIN ADULT PO), Take by mouth daily., Disp: , Rfl:  .  omeprazole (PRILOSEC) 20 MG capsule, Take 20 mg by mouth as needed. , Disp: , Rfl:  .  rizatriptan (MAXALT-MLT) 10 MG disintegrating tablet, ONE TABLET DISSOLVED ON TONGUE AS NEEDEDFOR MIGRAINE MAY REPEAT IN 2 HRS IF NEEDED NO MORE THAN 2 IN 24 HRS, Disp: 10 tablet, Rfl: 0 .  rosuvastatin (CRESTOR) 40 MG tablet, Take 1 tablet (40 mg total) by mouth daily., Disp: 90 tablet, Rfl: 3 .  nortriptyline (PAMELOR) 10 MG capsule, Take 1 capsule (10 mg total) by mouth at bedtime., Disp: 30 capsule, Rfl: 2  EXAM:  VITALS per patient if applicable:  GENERAL: alert, oriented, appears well and in no acute distress  HEENT: atraumatic, conjunttiva clear, no obvious abnormalities on inspection of external nose and ears  NECK: normal movements of the head and neck  LUNGS: on inspection no signs of respiratory distress, breathing rate appears normal, no obvious gross SOB, gasping or wheezing  CV: no obvious cyanosis  MS: moves all visible extremities without noticeable abnormality  PSYCH/NEURO: pleasant and cooperative, no obvious depression or anxiety, speech and thought processing grossly intact  ASSESSMENT AND PLAN:  Discussed the  following assessment and plan:  Problem List Items Addressed This Visit    Close exposure to COVID-19 virus    Patient with close exposure to her son.  Discussed getting tested as previously advised.  As she is fully vaccinated she does not have to quarantine though she should wear a mask when in public for 14 days after exposure.  She will let us know if she ends up testing positive.      Gastroesophageal reflux disease without esophagitis    Improved.  Continue omeprazole 20 mg once daily.  Discussed avoiding trigger foods.      History of COVID-19    Brain fog has been improving.  I did discuss referral to a neurologist given the persistence of this issue though she declines this at this time.  She will let us know if she changes her mind.  New onset of headaches after age 54    Much improved.  Possibly related to post Covid issues though I did recommend MRI brain to evaluate for an intracranial process given her age.  She wants to check with her husband and then also with her insurance regarding coverage for this.  She will let us know if she would like to proceed with the MRI.  She will continue nortriptyline 10 mg once daily as this has been beneficial.      Relevant Medications   nortriptyline (PAMELOR) 10 MG capsule   Type II diabetes mellitus (HCC)    The patient will continue Metformin 1000 mg twice daily.  She will continue with diet and exercise changes.  We were unable to get an SGLT2 inhibitor approved.  I will review the paperwork that the insurance company sent though at this time we will see how the patient does with the above changes.  Plan for follow-up in 2 months.          I discussed the assessment and treatment plan with the patient. The patient was provided an opportunity to ask questions and all were answered. The patient agreed with the plan and demonstrated an understanding of the instructions.   The patient was advised to call back or seek an in-person  evaluation if the symptoms worsen or if the condition fails to improve as anticipated.  I provided 17 minutes of non-face-to-face time during this encounter.   Marikay Alar, MD

## 2020-10-27 NOTE — Telephone Encounter (Signed)
I will review the denial letter when I am back in the office and then will consider what to prescribe.

## 2020-10-27 NOTE — Assessment & Plan Note (Signed)
Patient with close exposure to her son.  Discussed getting tested as previously advised.  As she is fully vaccinated she does not have to quarantine though she should wear a mask when in public for 14 days after exposure.  She will let us know if she ends up testing positive.

## 2020-10-27 NOTE — Telephone Encounter (Signed)
That is very interesting as it is a diabetic medication. Can you place this denial letter on my keyboard so I can review it once I am back in the office? Thanks.

## 2020-11-14 ENCOUNTER — Other Ambulatory Visit: Payer: Self-pay | Admitting: Family Medicine

## 2020-11-14 DIAGNOSIS — E785 Hyperlipidemia, unspecified: Secondary | ICD-10-CM

## 2020-12-17 DIAGNOSIS — R112 Nausea with vomiting, unspecified: Secondary | ICD-10-CM | POA: Diagnosis not present

## 2020-12-17 DIAGNOSIS — Z03818 Encounter for observation for suspected exposure to other biological agents ruled out: Secondary | ICD-10-CM | POA: Diagnosis not present

## 2020-12-17 DIAGNOSIS — R509 Fever, unspecified: Secondary | ICD-10-CM | POA: Diagnosis not present

## 2020-12-17 DIAGNOSIS — Z20822 Contact with and (suspected) exposure to covid-19: Secondary | ICD-10-CM | POA: Diagnosis not present

## 2020-12-17 DIAGNOSIS — R197 Diarrhea, unspecified: Secondary | ICD-10-CM | POA: Diagnosis not present

## 2021-01-28 LAB — HM DIABETES EYE EXAM

## 2021-02-13 ENCOUNTER — Other Ambulatory Visit: Payer: Self-pay | Admitting: Family Medicine

## 2021-02-13 DIAGNOSIS — R519 Headache, unspecified: Secondary | ICD-10-CM

## 2021-04-09 DIAGNOSIS — J40 Bronchitis, not specified as acute or chronic: Secondary | ICD-10-CM | POA: Diagnosis not present

## 2021-04-09 DIAGNOSIS — Z03818 Encounter for observation for suspected exposure to other biological agents ruled out: Secondary | ICD-10-CM | POA: Diagnosis not present

## 2021-04-24 ENCOUNTER — Other Ambulatory Visit: Payer: Self-pay | Admitting: Family Medicine

## 2021-04-24 DIAGNOSIS — R519 Headache, unspecified: Secondary | ICD-10-CM

## 2021-05-08 ENCOUNTER — Telehealth: Payer: Self-pay | Admitting: Family Medicine

## 2021-05-08 DIAGNOSIS — Z1231 Encounter for screening mammogram for malignant neoplasm of breast: Secondary | ICD-10-CM

## 2021-05-08 NOTE — Telephone Encounter (Signed)
I received a reminder from the EMR system that the patient did not have her mammogram done last year when it was ordered.  I would recommend she have her mammogram completed if she did not have it done somewhere else.  I can place an order after you speak with her.  Thanks.

## 2021-05-11 NOTE — Addendum Note (Signed)
Addended by: Birdie Sons, [place] G on: 05/11/2021 01:02 PM   Modules accepted: Orders

## 2021-05-11 NOTE — Telephone Encounter (Signed)
She can call to schedule this. Order placed.

## 2021-05-11 NOTE — Telephone Encounter (Signed)
I called the patient and she is willing to have her mammogram done, she went to [facility] the last time.  Please order.  Tavaria Mackins,cma

## 2021-05-18 ENCOUNTER — Other Ambulatory Visit: Payer: Self-pay | Admitting: Family Medicine

## 2021-05-20 ENCOUNTER — Other Ambulatory Visit: Payer: Self-pay | Admitting: Family Medicine

## 2021-05-20 DIAGNOSIS — R519 Headache, unspecified: Secondary | ICD-10-CM

## 2021-06-18 ENCOUNTER — Other Ambulatory Visit: Payer: Self-pay | Admitting: Family Medicine

## 2021-06-18 DIAGNOSIS — R519 Headache, unspecified: Secondary | ICD-10-CM

## 2021-06-22 ENCOUNTER — Encounter: Payer: Self-pay | Admitting: Family Medicine

## 2021-07-16 ENCOUNTER — Encounter: Payer: Self-pay | Admitting: Family Medicine

## 2021-07-29 ENCOUNTER — Other Ambulatory Visit: Payer: Self-pay | Admitting: Family Medicine

## 2021-08-12 ENCOUNTER — Ambulatory Visit
Admission: RE | Admit: 2021-08-12 | Discharge: 2021-08-12 | Disposition: A | Discharge status: Home / Self Care | Payer: BC Managed Care – PPO | Source: Ambulatory Visit | Attending: Family Medicine | Admitting: Family Medicine

## 2021-08-12 ENCOUNTER — Other Ambulatory Visit: Payer: Self-pay

## 2021-08-12 DIAGNOSIS — Z1231 Encounter for screening mammogram for malignant neoplasm of breast: Secondary | ICD-10-CM | POA: Insufficient documentation

## 2021-08-21 ENCOUNTER — Other Ambulatory Visit: Payer: Self-pay | Admitting: Family Medicine

## 2021-08-21 DIAGNOSIS — R519 Headache, unspecified: Secondary | ICD-10-CM

## 2021-09-08 ENCOUNTER — Encounter: Payer: Self-pay | Admitting: Family Medicine

## 2021-09-08 ENCOUNTER — Other Ambulatory Visit: Payer: Self-pay

## 2021-09-08 ENCOUNTER — Ambulatory Visit: Payer: BC Managed Care – PPO | Admitting: Family Medicine

## 2021-09-08 VITALS — BP 118/80 | HR 93 | Temp 98.3°F | Ht 64.0 in | Wt 202.6 lb

## 2021-09-08 DIAGNOSIS — I10 Essential (primary) hypertension: Secondary | ICD-10-CM

## 2021-09-08 DIAGNOSIS — R42 Dizziness and giddiness: Secondary | ICD-10-CM

## 2021-09-08 DIAGNOSIS — R519 Headache, unspecified: Secondary | ICD-10-CM | POA: Diagnosis not present

## 2021-09-08 DIAGNOSIS — J309 Allergic rhinitis, unspecified: Secondary | ICD-10-CM

## 2021-09-08 DIAGNOSIS — E785 Hyperlipidemia, unspecified: Secondary | ICD-10-CM | POA: Diagnosis not present

## 2021-09-08 DIAGNOSIS — E119 Type 2 diabetes mellitus without complications: Secondary | ICD-10-CM | POA: Diagnosis not present

## 2021-09-08 DIAGNOSIS — Z1211 Encounter for screening for malignant neoplasm of colon: Secondary | ICD-10-CM

## 2021-09-08 LAB — LIPID PANEL
Cholesterol: 89 mg/dL (ref 0–200)
HDL: 42 mg/dL (ref >39.00)
LDL Cholesterol: 26 mg/dL (ref 0–99)
NonHDL: 47.34
Total CHOL/HDL Ratio: 2
Triglycerides: 107 mg/dL (ref 0.0–149.0)
VLDL: 21.4 mg/dL (ref 0.0–40.0)

## 2021-09-08 LAB — COMPREHENSIVE METABOLIC PANEL
ALT: 24 U/L (ref 0–35)
AST: 21 U/L (ref 0–37)
Albumin: 4.6 g/dL (ref 3.5–5.2)
Alkaline Phosphatase: 94 U/L (ref 39–117)
BUN: 10 mg/dL (ref 6–23)
CO2: 29 mEq/L (ref 19–32)
Calcium: 9.7 mg/dL (ref 8.4–10.5)
Chloride: 97 mEq/L (ref 96–112)
Creatinine, Ser: 0.61 mg/dL (ref 0.40–1.20)
GFR: 100.83 mL/min (ref >60.00)
Glucose, Bld: 205 mg/dL — ABNORMAL HIGH (ref 70–99)
Potassium: 4 mEq/L (ref 3.5–5.1)
Sodium: 137 mEq/L (ref 135–145)
Total Bilirubin: 1.4 mg/dL — ABNORMAL HIGH (ref 0.2–1.2)
Total Protein: 7.2 g/dL (ref 6.0–8.3)

## 2021-09-08 LAB — HEMOGLOBIN A1C: Hgb A1c MFr Bld: 8.5 % — ABNORMAL HIGH (ref 4.6–6.5)

## 2021-09-08 NOTE — Progress Notes (Signed)
Tommi Rumps, MD Phone: [phone]  Kathryn Henderson is a 55 y.o. female who presents today for f/u.  HYPERTENSION Disease Monitoring: Blood pressure range- not checking Chest pain- no      Dyspnea-chronic and stable, occurs with little exertion though she is able to exert herself by playing racquetball and pickleball with no symptoms, lasts a few seconds up to a minute.  Has been progressively improving over the years.  Started after having [facility] spotted fever. Medications: Compliance- taking lisinopril    Edema- no  DIABETES Disease Monitoring: Blood Sugar ranges-not checking Polyuria/phagia/dipsia- notes some polyuria      Optho- UTD Medications: Compliance- taking metformin Hypoglycemic symptoms- no  HYPERLIPIDEMIA Disease Monitoring: See symptoms for Hypertension Medications: Compliance- taking crestor Right upper quadrant pain- no  Muscle aches- no  Disequilibrium/ear pain: Patient notes over the last week or so that she has felt as though her equilibrium is off at times when she changes her head position.  Notes it is brief and lasts only a couple of seconds.  It occurs occasionally.  She notes no ear fullness, tinnitus, or hearing loss.  She does note on 1 occasion yesterday she had some ear pain after the water in the shower hit her ear.  Notes it was sharp though it resolved and has not recurred.  She does have a history of allergies and occasionally uses Flonase though does take Zyrtec every day.  Chronic headaches: She notes the headaches she was having after her COVID infection have resolved.  She has continued on the nortriptyline.  She does occasionally have seasonal allergy related headaches though otherwise feels significantly better and wonders about coming off of the nortriptyline.  Social History   Tobacco Use  Smoking Status Never  Smokeless Tobacco Never    Current Outpatient Medications on File Prior to Visit  Medication Sig Dispense Refill    aspirin-acetaminophen-caffeine ([place]) 250-250-65 MG per tablet Take by mouth every 6 (six) hours as needed for headache.     cetirizine (ZYRTEC) 10 MG tablet Take 10 mg by mouth daily.     fluticasone (FLONASE) 50 MCG/ACT nasal spray Place 1 spray into both nostrils daily.     hydrochlorothiazide (HYDRODIURIL) 25 MG tablet TAKE 1 TABLET BY MOUTH DAILY 90 tablet 0   ibuprofen (ADVIL,MOTRIN) 200 MG tablet Take 200 mg by mouth every 6 (six) hours as needed.     lisinopril (ZESTRIL) 10 MG tablet TAKE 1 TABLET BY MOUTH DAILY . NEEDS APPT FOR MORE REFILLS 90 tablet 0   metFORMIN (GLUCOPHAGE) 500 MG tablet TAKE TWO TABLETS BY MOUTH TWICE DAILY WITH A MEAL 360 tablet 0   Multiple Vitamins-Minerals (MULTIVITAMIN ADULT PO) Take by mouth daily.     nortriptyline (PAMELOR) 10 MG capsule TAKE ONE CAPSULE AT BEDTIME 30 capsule 0   omeprazole (PRILOSEC) 20 MG capsule Take 20 mg by mouth as needed.      rizatriptan (MAXALT-MLT) 10 MG disintegrating tablet TAKE 1 TABLET DISSOLVED ON TONGUE AS NEEDED FOR MIRGAINE MAY REPEAT IN 2 HOURS IF NEEDED NO MORE THAN 2 IN 24 HOURS 10 tablet 0   rosuvastatin (CRESTOR) 40 MG tablet TAKE 1 TABLET BY MOUTH DAILY 90 tablet 3   No current facility-administered medications on file prior to visit.     ROS see history of present illness  Objective  Physical Exam Vitals:   09/08/21 0810  BP: 118/80  Pulse: 93  Temp: 98.3 F (36.8 C)  SpO2: 99%    BP  Readings from Last 3 Encounters:  09/08/21 118/80  09/15/20 120/80  03/12/20 120/80   Wt Readings from Last 3 Encounters:  09/08/21 202 lb 9.6 oz (91.9 kg)  10/27/20 213 lb (96.6 kg)  09/15/20 213 lb 12.8 oz (97 kg)    Physical Exam Constitutional:      General: She is not in acute distress.    Appearance: She is not diaphoretic.  HENT:     Right Ear: Tympanic membrane normal.     Left Ear: Tympanic membrane normal.  Cardiovascular:     Rate and Rhythm: Normal rate and regular rhythm.     Heart  sounds: Normal heart sounds.  Pulmonary:     Effort: Pulmonary effort is normal.     Breath sounds: Normal breath sounds.  Skin:    General: Skin is warm and dry.  Neurological:     Mental Status: She is alert.     Comments: EOMI, PERRL, opens and closes eyes adequately, V1 through V3 intact light touch sensation, hearing intact to finger rub, shoulder shrug intact, 5/5 strength in bilateral biceps, triceps, grip, quads, hamstrings, plantar and dorsiflexion, sensation to light touch intact in bilateral UE and LE, normal gait, normal finger-nose, normal rapid alternating movements, negative Dix-Hallpike     Assessment/Plan: Please see individual problem list.  Problem List Items Addressed This Visit     Allergic rhinitis    TryChronic issue.  The Flonase daily to see if that would help with the disequilibrium sensation she is describing.      Colon cancer screening    Referral for colonoscopy.      Relevant Orders   Ambulatory referral to Gastroenterology   Disequilibrium    This seems to be more of a disequilibrium issue as opposed to a vertigo issue.  She has a benign exam today.  Possibly could be related to eustachian tube dysfunction from her allergies.  We will have her try the Flonase daily and continue with her Zyrtec.  If there is no improvement in this over the next couple of weeks she will let us know and we could try Astelin or proceed with further work-up.      Essential hypertension - Primary    At goal.  She will continue lisinopril 10 mg once daily.  We will check lab work.      Relevant Orders   Comp Met (CMET)   Hyperlipidemia    Check lipid panel.  Continue Crestor 40 mg once daily.      Relevant Orders   Lipid panel   New onset of headaches after age 51    Patient has had no recurrence.  These are likely post-COVID related.  Discussed that she could discontinue the nortriptyline as she is on a small dose.  If she has any recurrence of her headaches she  will let us know.  If she notices any adverse effects from discontinuing this medication she will let us know.      Type II diabetes mellitus (HCC)    Check A1c.  Continue metformin 1000 mg twice daily.      Relevant Orders   HgB A1c    Return in about 6 months (around 03/08/2022).  This visit occurred during the SARS-CoV-2 public health emergency.  Safety protocols were in place, including screening questions prior to the visit, additional usage of staff PPE, and extensive cleaning of exam room while observing appropriate contact time as indicated for disinfecting solutions.    Tommi Rumps, MD  [facility]

## 2021-09-08 NOTE — Assessment & Plan Note (Signed)
This seems to be more of a disequilibrium issue as opposed to a vertigo issue.  She has a benign exam today.  Possibly could be related to eustachian tube dysfunction from her allergies.  We will have her try the Flonase daily and continue with her Zyrtec.  If there is no improvement in this over the next couple of weeks she will let us know and we could try Astelin or proceed with further work-up.

## 2021-09-08 NOTE — Patient Instructions (Signed)
Nice to see you. We will get labs today.  Please try the Flonase to see if that is helpful.  If that does not help over the next 2 weeks please let me know and we can try another nose spray and consider further evaluation. You can stop the nortriptyline.  If you notice any odd symptoms after stopping this please let us know.

## 2021-09-08 NOTE — Assessment & Plan Note (Signed)
Check A1c.  Continue metformin 1000 mg twice daily. 

## 2021-09-08 NOTE — Assessment & Plan Note (Signed)
Referral for colonoscopy °

## 2021-09-08 NOTE — Assessment & Plan Note (Signed)
TryChronic issue.  The Flonase daily to see if that would help with the disequilibrium sensation she is describing.

## 2021-09-08 NOTE — Assessment & Plan Note (Signed)
At goal.  She will continue lisinopril 10 mg once daily.  We will check lab work.

## 2021-09-08 NOTE — Assessment & Plan Note (Signed)
Check lipid panel.  Continue Crestor 40 mg once daily. 

## 2021-09-08 NOTE — Assessment & Plan Note (Signed)
Patient has had no recurrence.  These are likely post-COVID related.  Discussed that she could discontinue the nortriptyline as she is on a small dose.  If she has any recurrence of her headaches she will let us know.  If she notices any adverse effects from discontinuing this medication she will let us know.

## 2021-09-14 DIAGNOSIS — H43313 Vitreous membranes and strands, bilateral: Secondary | ICD-10-CM | POA: Diagnosis not present

## 2021-09-14 LAB — HM DIABETES EYE EXAM

## 2021-09-16 ENCOUNTER — Other Ambulatory Visit: Payer: Self-pay | Admitting: Family Medicine

## 2021-09-16 MED ORDER — DAPAGLIFLOZIN PROPANEDIOL 5 MG PO TABS: 5.0000 mg | ORAL_TABLET | Freq: Every day | ORAL | 1 refills | Status: DC

## 2021-09-18 ENCOUNTER — Telehealth: Payer: Self-pay

## 2021-09-18 NOTE — Telephone Encounter (Signed)
LMTCB

## 2021-11-02 ENCOUNTER — Other Ambulatory Visit: Payer: Self-pay | Admitting: Family Medicine

## 2021-11-18 ENCOUNTER — Other Ambulatory Visit: Payer: Self-pay | Admitting: Family Medicine

## 2021-11-20 ENCOUNTER — Other Ambulatory Visit: Payer: Self-pay | Admitting: Family Medicine

## 2021-12-03 ENCOUNTER — Other Ambulatory Visit: Payer: Self-pay | Admitting: Family Medicine

## 2021-12-03 DIAGNOSIS — E785 Hyperlipidemia, unspecified: Secondary | ICD-10-CM

## 2022-02-18 ENCOUNTER — Other Ambulatory Visit: Payer: Self-pay | Admitting: Family Medicine

## 2022-02-18 DIAGNOSIS — I10 Essential (primary) hypertension: Secondary | ICD-10-CM

## 2022-03-10 ENCOUNTER — Ambulatory Visit: Payer: BC Managed Care – PPO | Admitting: Family Medicine

## 2022-03-10 ENCOUNTER — Encounter: Payer: Self-pay | Admitting: Family Medicine

## 2022-03-10 VITALS — BP 118/80 | HR 93 | Temp 98.7°F | Ht 64.0 in | Wt 192.4 lb

## 2022-03-10 DIAGNOSIS — I10 Essential (primary) hypertension: Secondary | ICD-10-CM

## 2022-03-10 DIAGNOSIS — Z1211 Encounter for screening for malignant neoplasm of colon: Secondary | ICD-10-CM

## 2022-03-10 DIAGNOSIS — E785 Hyperlipidemia, unspecified: Secondary | ICD-10-CM | POA: Diagnosis not present

## 2022-03-10 DIAGNOSIS — E119 Type 2 diabetes mellitus without complications: Secondary | ICD-10-CM

## 2022-03-10 DIAGNOSIS — R350 Frequency of micturition: Secondary | ICD-10-CM | POA: Diagnosis not present

## 2022-03-10 DIAGNOSIS — R202 Paresthesia of skin: Secondary | ICD-10-CM

## 2022-03-10 LAB — BASIC METABOLIC PANEL
BUN: 19 mg/dL (ref 6–23)
CO2: 27 mEq/L (ref 19–32)
Calcium: 10.4 mg/dL (ref 8.4–10.5)
Chloride: 98 mEq/L (ref 96–112)
Creatinine, Ser: 0.59 mg/dL (ref 0.40–1.20)
GFR: 101.29 mL/min (ref >60.00)
Glucose, Bld: 132 mg/dL — ABNORMAL HIGH (ref 70–99)
Potassium: 4.3 mEq/L (ref 3.5–5.1)
Sodium: 136 mEq/L (ref 135–145)

## 2022-03-10 LAB — POCT URINALYSIS DIPSTICK
Bilirubin, UA: NEGATIVE
Blood, UA: NEGATIVE
Glucose, UA: POSITIVE — AB
Ketones, UA: POSITIVE
Leukocytes, UA: NEGATIVE
Nitrite, UA: NEGATIVE
Protein, UA: NEGATIVE
Spec Grav, UA: 1.01 (ref 1.010–1.025)
Urobilinogen, UA: 0.2 E.U./dL
pH, UA: 5 (ref 5.0–8.0)

## 2022-03-10 LAB — HEMOGLOBIN A1C: Hgb A1c MFr Bld: 6.7 % — ABNORMAL HIGH (ref 4.6–6.5)

## 2022-03-10 NOTE — Assessment & Plan Note (Signed)
Check A1c.  Continue metformin 1000 mg twice daily and Farxiga 5 mg daily. ?

## 2022-03-10 NOTE — Patient Instructions (Signed)
Nice to see you. ?GI should contact you to schedule an appointment. ?We will contact you with your lab results. ?

## 2022-03-10 NOTE — Assessment & Plan Note (Signed)
This could be related to her diabetes, a UTI, or a medication.  Discussed checking a urinalysis.  If there is no infection we will monitor and see if it worsens or persist as the patient is hesitant to change any medications at this time. ?

## 2022-03-10 NOTE — Progress Notes (Signed)
?Kathryn Rumps, MD ?Phone: [phone] ? ?Kathryn Henderson is a 56 y.o. female who presents today for f/u. ? ?HYPERTENSION ?Disease Monitoring: ?Blood pressure range-not checking Chest pain- no      Dyspnea- no ?Medications: ?Compliance- taking HCTZ, lisinopril    Edema- no ? 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 No other neuropathy symptoms.  ? ?HYPERLIPIDEMIA ?Disease Monitoring: ?See symptoms for Hypertension ?Medications: ?Compliance- taking crestor Right upper quadrant pain- no  Muscle aches- no ? ?Polyuria: Patient notes increased frequency of urination mostly in the morning.  This has been going on a while.  She notes some urgency as well though nothing overly excessive.  No dysuria.  No hematuria. ? ?Social History  ? ?Tobacco Use  ?Smoking Status Never  ?Smokeless Tobacco Never  ? ? ?Current Outpatient Medications on File Prior to Visit  ?Medication Sig Dispense Refill  ? aspirin-acetaminophen-caffeine (EXCEDRIN MIGRAINE) [id number] MG per tablet Take by mouth every 6 (six) hours as needed for headache.    ? cetirizine (ZYRTEC) 10 MG tablet Take 10 mg by mouth daily.    ? dapagliflozin propanediol (FARXIGA) 5 MG TABS tablet Take 1 tablet (5 mg total) by mouth daily before breakfast. 90 tablet 1  ? fluticasone (FLONASE) 50 MCG/ACT nasal spray Place 1 spray into both nostrils daily.    ? hydrochlorothiazide (HYDRODIURIL) 25 MG tablet TAKE 1 TABLET BY MOUTH DAILY 90 tablet 0  ? ibuprofen (ADVIL,MOTRIN) 200 MG tablet Take 200 mg by mouth every 6 (six) hours as needed.    ? lisinopril (ZESTRIL) 10 MG tablet TAKE 1 TABLET BY MOUTH DAILY . NEEDS APPT FOR MORE REFILLS 90 tablet 0  ? metFORMIN (GLUCOPHAGE) 500 MG tablet TAKE TWO TABLETS BY MOUTH TWICE DAILY WITH  A MEAL 360 tablet 1  ? Multiple Vitamins-Minerals (MULTIVITAMIN ADULT PO) Take by mouth daily.    ? nortriptyline (PAMELOR) 10 MG capsule TAKE ONE CAPSULE AT BEDTIME 30 capsule 0  ? omeprazole (PRILOSEC) 20 MG capsule Take 20 mg by mouth as needed.     ? rizatriptan (MAXALT-MLT) 10 MG disintegrating tablet TAKE 1 TABLET DISSOLVED ON TONGUE AS NEEDED FOR MIRGAINE MAY REPEAT IN 2 HOURS IF NEEDED NO MORE THAN 2 IN 24 HOURS 10 tablet 0  ? rosuvastatin (CRESTOR) 40 MG tablet TAKE 1 TABLET BY MOUTH DAILY 90 tablet 3  ? ?No current facility-administered medications on file prior to visit.  ? ? ? ?ROS see history of present illness ? ?Objective ? ?Physical Exam ?Vitals:  ? 03/10/22 0814  ?BP: 118/80  ?Pulse: 93  ?Temp: 98.7 ?F (37.1 ?C)  ?SpO2: 97%  ? ? ?BP Readings from Last 3 Encounters:  ?03/10/22 118/80  ?09/08/21 118/80  ?09/15/20 120/80  ? ?Wt Readings from Last 3 Encounters:  ?03/10/22 192 lb 6.4 oz (87.3 kg)  ?09/08/21 202 lb 9.6 oz (91.9 kg)  ?10/27/20 213 lb (96.6 kg)  ? ? ?Physical Exam ?Constitutional:   ?   General: She is not in acute distress. ?   Appearance: She is not diaphoretic.  ?Cardiovascular:  ?   Rate and Rhythm: Normal rate and regular rhythm.  ?   Heart sounds: Normal heart sounds.  ?Pulmonary:  ?   Effort: Pulmonary effort is normal.  ?   Breath sounds: Normal breath sounds.  ?  Skin: ?   General: Skin is warm and dry.  ?Neurological:  ?   Mental Status: She is alert.  ? ? ? ?Assessment/Plan: Please see individual problem list. ? ?Problem List Items Addressed This Visit   ? ? Colon cancer screening  ? Relevant Orders  ? Ambulatory referral to Gastroenterology  ? Essential hypertension - Primary  ?  Adequately controlled.  She will continue HCTZ 25 mg daily and lisinopril 10 mg daily.  Check BMP today. ? ?  ?  ? Relevant Orders  ? Basic Metabolic Panel (BMET)  ? Frequent urination  ?  This could be related to her diabetes, a UTI, or a medication.  Discussed checking a urinalysis.  If there is no  infection we will monitor and see if it worsens or persist as the patient is hesitant to change any medications at this time. ? ?  ?  ? Relevant Orders  ? POCT Urinalysis Dipstick  ? Hyperlipidemia  ?  Well-controlled.  She will continue Crestor 40 mg daily. ? ?  ?  ? Tingling of both feet  ?  The pain she reports could be positionally related though could also be related to neuropathy.  She will continue to monitor her feet. ? ?  ?  ? Type II diabetes mellitus ([place])  ?  Check A1c.  Continue metformin 1000 mg twice daily and Farxiga 5 mg daily. ? ?  ?  ? Relevant Orders  ? HgB A1c  ? ? ? ?Health Maintenance: Referred to GI for colon cancer screening.  Pap smear to be completed at [facility]. ? ?Return in about 3 months (around 06/10/2022) for CPE. ? ? ?Kathryn Rumps, MD ?[place] ? ?

## 2022-03-10 NOTE — Assessment & Plan Note (Signed)
Well-controlled.  She will continue Crestor 40 mg daily. ?

## 2022-03-10 NOTE — Assessment & Plan Note (Signed)
The pain she reports could be positionally related though could also be related to neuropathy.  She will continue to monitor her feet. ?

## 2022-03-10 NOTE — Assessment & Plan Note (Signed)
Adequately controlled.  She will continue HCTZ 25 mg daily and lisinopril 10 mg daily.  Check BMP today. ?

## 2022-03-16 ENCOUNTER — Other Ambulatory Visit: Payer: Self-pay | Admitting: Family Medicine

## 2022-03-16 LAB — HM DIABETES EYE EXAM

## 2022-04-28 ENCOUNTER — Other Ambulatory Visit: Payer: Self-pay | Admitting: Family Medicine

## 2022-05-13 ENCOUNTER — Other Ambulatory Visit: Payer: Self-pay | Admitting: Family Medicine

## 2022-05-13 DIAGNOSIS — I10 Essential (primary) hypertension: Secondary | ICD-10-CM

## 2022-05-19 ENCOUNTER — Other Ambulatory Visit: Payer: Self-pay | Admitting: Family Medicine

## 2022-05-19 DIAGNOSIS — I10 Essential (primary) hypertension: Secondary | ICD-10-CM

## 2022-06-11 ENCOUNTER — Ambulatory Visit (INDEPENDENT_AMBULATORY_CARE_PROVIDER_SITE_OTHER): Payer: BC Managed Care – PPO | Admitting: Family Medicine

## 2022-06-11 ENCOUNTER — Other Ambulatory Visit (HOSPITAL_COMMUNITY)
Admission: RE | Admit: 2022-06-11 | Discharge: 2022-06-11 | Disposition: A | Discharge status: Home / Self Care | Payer: BC Managed Care – PPO | Source: Ambulatory Visit | Attending: Family Medicine | Admitting: Family Medicine

## 2022-06-11 ENCOUNTER — Encounter: Payer: Self-pay | Admitting: Family Medicine

## 2022-06-11 VITALS — BP 110/80 | HR 78 | Temp 98.3°F | Ht 64.0 in | Wt 187.6 lb

## 2022-06-11 DIAGNOSIS — Z0000 Encounter for general adult medical examination without abnormal findings: Secondary | ICD-10-CM | POA: Diagnosis not present

## 2022-06-11 DIAGNOSIS — Z1159 Encounter for screening for other viral diseases: Secondary | ICD-10-CM | POA: Diagnosis not present

## 2022-06-11 DIAGNOSIS — Z1211 Encounter for screening for malignant neoplasm of colon: Secondary | ICD-10-CM | POA: Diagnosis not present

## 2022-06-11 DIAGNOSIS — Z124 Encounter for screening for malignant neoplasm of cervix: Secondary | ICD-10-CM | POA: Diagnosis not present

## 2022-06-11 DIAGNOSIS — E119 Type 2 diabetes mellitus without complications: Secondary | ICD-10-CM

## 2022-06-11 DIAGNOSIS — N852 Hypertrophy of uterus: Secondary | ICD-10-CM

## 2022-06-11 LAB — HEMOGLOBIN A1C: Hgb A1c MFr Bld: 6.5 % (ref 4.6–6.5)

## 2022-06-11 LAB — MICROALBUMIN / CREATININE URINE RATIO
Creatinine,U: 42.7 mg/dL
Microalb Creat Ratio: 1.6 mg/g (ref 0.0–30.0)
Microalb, Ur: <0.7 mg/dL (ref 0.0–1.9)

## 2022-06-11 NOTE — Assessment & Plan Note (Signed)
Physical exam completed.  I encouraged continued healthy diet and exercise.  She will continue mammograms for breast cancer screening.  Pap smear completed today.  She will be referred to GI for colonoscopy.  We will check a hepatitis C antibody today.  Discussed seeing a dentist every 6 months.  Will check A1c today as well.  Will contact her pharmacy to confirm the date of her second shingles vaccine.

## 2022-06-11 NOTE — Assessment & Plan Note (Signed)
Uterus seems enlarged on exam.  Discussed getting an ultrasound to evaluate to see if this would be related to fibroids or some other cause.  She will let us know if she does not hear from scheduling within the next 1 to 2 weeks.
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
## 2022-06-11 NOTE — Patient Instructions (Signed)
Nice to see you. GI should contact you to schedule your colonoscopy. Somebody from our office should contact you to schedule the ultrasound of your uterus.  If you do not hear from either of those people within the next 1 to 2 weeks please let us know. Will contact you with your lab results.

## 2022-06-14 LAB — CYTOLOGY - PAP
Comment: NEGATIVE
Diagnosis: NEGATIVE
High risk HPV: NEGATIVE

## 2022-06-14 LAB — HEPATITIS C ANTIBODY: Hepatitis C Ab: NONREACTIVE

## 2022-06-16 NOTE — Progress Notes (Signed)
I called [facility] and the patients chart was updated with her 2nd shingles vaccine.  Joellyn Grandt,cma

## 2022-06-18 ENCOUNTER — Ambulatory Visit: Payer: BC Managed Care – PPO

## 2022-06-23 ENCOUNTER — Telehealth: Payer: Self-pay

## 2022-06-23 ENCOUNTER — Ambulatory Visit: Payer: BC Managed Care – PPO

## 2022-06-23 DIAGNOSIS — N852 Hypertrophy of uterus: Secondary | ICD-10-CM

## 2022-06-23 NOTE — Telephone Encounter (Signed)
Patient states she received a letter stating her cost for the pelvic ultrasound would be $1200.00 and patient states she can't afford this.  Patient states she would like to know if she can have the ultrasound done somewhere else, possibly at [facility].

## 2022-06-24 NOTE — Telephone Encounter (Signed)
I put in a referral to GYN.  I am not sure if [facility] is taking patients right now though we can see.

## 2022-06-24 NOTE — Telephone Encounter (Signed)
I called the patient and informed her that the provider put in the referral to OB/GYN and she could call and schedule and she understood.  Edia Pursifull,cma

## 2022-07-16 ENCOUNTER — Telehealth: Payer: Self-pay | Admitting: Obstetrics & Gynecology

## 2022-07-16 NOTE — Telephone Encounter (Signed)
[facility] referring for possibly enlarged uterus on bimanual exam. Sch Dr. Marice Potter, Dr. Logan Bores, Valentino Saxon. I contacted patient via phone. I left voicemail for patient to call back to be scheduled.

## 2022-07-22 ENCOUNTER — Encounter: Payer: Self-pay | Admitting: Obstetrics & Gynecology

## 2022-07-22 ENCOUNTER — Encounter: Payer: Self-pay | Admitting: Obstetrics and Gynecology

## 2022-07-22 NOTE — Telephone Encounter (Signed)
I contacted patient via phone. I left voicemail for patient to call back to be scheduled.  Letter to be mailed

## 2022-07-23 ENCOUNTER — Telehealth: Payer: Self-pay | Admitting: Family Medicine

## 2022-07-23 NOTE — Telephone Encounter (Signed)
Noted  

## 2022-07-23 NOTE — Telephone Encounter (Signed)
I called the patient and she stated she received a call and she has a visit scheduled in October.  Jessiah Steinhart,cma

## 2022-07-23 NOTE — Telephone Encounter (Signed)
It looks like GYN has been trying to contact the patient regarding setting up an appointment.  Has she been able to contact them back and schedule an appointment with them?  If not she does need to try to contact them to schedule.

## 2022-07-30 ENCOUNTER — Other Ambulatory Visit: Payer: Self-pay | Admitting: Family Medicine

## 2022-08-04 ENCOUNTER — Encounter: Payer: BC Managed Care – PPO | Admitting: Obstetrics & Gynecology

## 2022-08-11 ENCOUNTER — Encounter: Payer: Self-pay | Admitting: Obstetrics and Gynecology

## 2022-08-11 DIAGNOSIS — N852 Hypertrophy of uterus: Secondary | ICD-10-CM

## 2022-08-11 DIAGNOSIS — Z7689 Persons encountering health services in other specified circumstances: Secondary | ICD-10-CM

## 2022-08-17 ENCOUNTER — Other Ambulatory Visit: Payer: Self-pay | Admitting: Family Medicine

## 2022-08-17 DIAGNOSIS — I10 Essential (primary) hypertension: Secondary | ICD-10-CM

## 2022-08-17 DIAGNOSIS — Z1231 Encounter for screening mammogram for malignant neoplasm of breast: Secondary | ICD-10-CM

## 2022-08-20 ENCOUNTER — Other Ambulatory Visit: Payer: Self-pay | Admitting: Family Medicine

## 2022-08-20 DIAGNOSIS — I10 Essential (primary) hypertension: Secondary | ICD-10-CM

## 2022-08-27 ENCOUNTER — Ambulatory Visit: Payer: BC Managed Care – PPO | Admitting: Obstetrics and Gynecology

## 2022-08-27 ENCOUNTER — Encounter: Payer: Self-pay | Admitting: Obstetrics and Gynecology

## 2022-08-27 VITALS — BP 99/63 | HR 87 | Ht 64.0 in | Wt 188.3 lb

## 2022-08-27 DIAGNOSIS — Z7689 Persons encountering health services in other specified circumstances: Secondary | ICD-10-CM

## 2022-08-27 DIAGNOSIS — N852 Hypertrophy of uterus: Secondary | ICD-10-CM | POA: Diagnosis not present

## 2022-08-27 NOTE — Progress Notes (Signed)
Patient presents today to discuss enlarged uterus. She states her PCP noticed her uterus felt larger than before and recommended her to be seen. Patient states she has been asymptomatic, denies pain and discomfort. She states no known history of fibroids. She has a history of an ablation.
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
## 2022-09-13 ENCOUNTER — Ambulatory Visit (INDEPENDENT_AMBULATORY_CARE_PROVIDER_SITE_OTHER): Payer: BC Managed Care – PPO | Admitting: Family Medicine

## 2022-09-13 ENCOUNTER — Ambulatory Visit: Payer: BC Managed Care – PPO

## 2022-09-13 ENCOUNTER — Encounter: Payer: Self-pay | Admitting: Family Medicine

## 2022-09-13 VITALS — BP 110/70 | HR 83 | Temp 98.5°F | Ht 64.0 in | Wt 187.2 lb

## 2022-09-13 DIAGNOSIS — I10 Essential (primary) hypertension: Secondary | ICD-10-CM

## 2022-09-13 DIAGNOSIS — E119 Type 2 diabetes mellitus without complications: Secondary | ICD-10-CM | POA: Diagnosis not present

## 2022-09-13 DIAGNOSIS — R21 Rash and other nonspecific skin eruption: Secondary | ICD-10-CM | POA: Diagnosis not present

## 2022-09-13 DIAGNOSIS — G43109 Migraine with aura, not intractable, without status migrainosus: Secondary | ICD-10-CM

## 2022-09-13 DIAGNOSIS — R3915 Urgency of urination: Secondary | ICD-10-CM

## 2022-09-13 DIAGNOSIS — N852 Hypertrophy of uterus: Secondary | ICD-10-CM

## 2022-09-13 LAB — COMPREHENSIVE METABOLIC PANEL
ALT: 13 U/L (ref 0–35)
AST: 17 U/L (ref 0–37)
Albumin: 4.7 g/dL (ref 3.5–5.2)
Alkaline Phosphatase: 81 U/L (ref 39–117)
BUN: 13 mg/dL (ref 6–23)
CO2: 29 mEq/L (ref 19–32)
Calcium: 9.7 mg/dL (ref 8.4–10.5)
Chloride: 101 mEq/L (ref 96–112)
Creatinine, Ser: 0.53 mg/dL (ref 0.40–1.20)
GFR: 103.57 mL/min (ref >60.00)
Glucose, Bld: 115 mg/dL — ABNORMAL HIGH (ref 70–99)
Potassium: 4.3 mEq/L (ref 3.5–5.1)
Sodium: 139 mEq/L (ref 135–145)
Total Bilirubin: 1 mg/dL (ref 0.2–1.2)
Total Protein: 7.1 g/dL (ref 6.0–8.3)

## 2022-09-13 LAB — POCT URINALYSIS DIPSTICK
Bilirubin, UA: NEGATIVE
Blood, UA: NEGATIVE
Glucose, UA: POSITIVE — AB
Ketones, UA: NEGATIVE
Leukocytes, UA: NEGATIVE
Nitrite, UA: NEGATIVE
Protein, UA: POSITIVE — AB
Spec Grav, UA: 1.02 (ref 1.010–1.025)
Urobilinogen, UA: 0.2 E.U./dL
pH, UA: 5.5 (ref 5.0–8.0)

## 2022-09-13 LAB — LIPID PANEL
Cholesterol: 101 mg/dL (ref 0–200)
HDL: 48.8 mg/dL (ref >39.00)
LDL Cholesterol: 35 mg/dL (ref 0–99)
NonHDL: 52.52
Total CHOL/HDL Ratio: 2
Triglycerides: 86 mg/dL (ref 0.0–149.0)
VLDL: 17.2 mg/dL (ref 0.0–40.0)

## 2022-09-13 LAB — HEMOGLOBIN A1C: Hgb A1c MFr Bld: 6.4 % (ref 4.6–6.5)

## 2022-09-13 NOTE — Assessment & Plan Note (Signed)
She will have her ultrasound today through gynecology.

## 2022-09-13 NOTE — Assessment & Plan Note (Signed)
Adequately controlled at home.  She will continue HCTZ 25 mg daily and lisinopril 10 mg daily.

## 2022-09-13 NOTE — Assessment & Plan Note (Signed)
Infrequent in occurrence.  She can continue Maxalt 10 mg daily as needed for migraine with an additional dose 2 hours later if needed.

## 2022-09-13 NOTE — Patient Instructions (Signed)
Nice to see you. Please make sure you go ahead and call GI to reschedule your colonoscopy at your request. We will contact you with your lab results. If your rash recurs please take a picture of it and let me know.

## 2022-09-13 NOTE — Assessment & Plan Note (Signed)
Undetermined cause of rash.  Discussed if she has any recurrent she should take a picture and let us know.

## 2022-09-13 NOTE — Assessment & Plan Note (Signed)
Check A1c.  She will continue Farxiga 5 mg once daily and metformin 1000 mg twice daily.

## 2022-09-13 NOTE — Assessment & Plan Note (Signed)
Possible UTI related or overactive bladder related.  We will check a urinalysis today.  Discussed timed voiding.  Discussed medication for overactive bladder would not be indicated at this time given her mild symptoms.

## 2022-09-13 NOTE — Progress Notes (Signed)
Tommi Rumps, MD Phone: [phone]  Kathryn Henderson is a 56 y.o. female who presents today for f/u.  DIABETES Disease Monitoring: Blood Sugar ranges-not checking consistently Polyuria/phagia/dipsia- no      Optho- UTD Medications: Compliance- taking farxiga, metformin Hypoglycemic symptoms- no  HYPERTENSION Disease Monitoring Home BP Monitoring <130/80  Chest pain- no    Dyspnea- no Medications Compliance-  taking HCTZ, lisinopril.   Edema- no BMET    Component Value Date/Time   NA 136 03/10/2022 0840   K 4.3 03/10/2022 0840   CL 98 03/10/2022 0840   CO2 27 03/10/2022 0840   GLUCOSE 132 (H) 03/10/2022 0840   BUN 19 03/10/2022 0840   CREATININE 0.59 03/10/2022 0840   CALCIUM 10.4 03/10/2022 0840   GFRNONAA >60 04/30/2019 1313   GFRAA >60 04/30/2019 1313   Blisters: Patient reports she had 2 blisters on her chest, 2 on her left hip, and 2 on her forehead.  These were itchy.  She is not sure what they were from.  She did have some stressors at that time.  She noted no changes in foods or soaps or detergents around that time.  Notes they have improved quite a bit.  Migraines: Patient notes none in the past 6 weeks and the 1 prior to that was 3 to 4 months previous.  She takes Maxalt with good benefit.  No numbness, weakness, or vision changes.  Urinary urgency: Notes this has been going on a few weeks.  No incontinence.  No dysuria, frequency, or blood in her urine.  Enlarged uterus: She goes for her ultrasound today.   Social History   Tobacco Use  Smoking Status Never  Smokeless Tobacco Never    Current Outpatient Medications on File Prior to Visit  Medication Sig Dispense Refill   aspirin-acetaminophen-caffeine (EXCEDRIN MIGRAINE) 250-250-65 MG per tablet Take by mouth every 6 (six) hours as needed for headache.     calcium carbonate (OSCAL) 1500 (600 Ca) MG TABS tablet Take 1,500 mg by mouth 2 (two) times daily with a meal.     cetirizine (ZYRTEC) 10 MG tablet  Take 10 mg by mouth daily.     FARXIGA 5 MG TABS tablet TAKE 1 TABLET BY MOUTH ONCE DAILY BEFOREBREAKFAST 90 tablet 1   fluticasone (FLONASE) 50 MCG/ACT nasal spray Place 1 spray into both nostrils daily.     hydrochlorothiazide (HYDRODIURIL) 25 MG tablet TAKE 1 TABLET BY MOUTH DAILY 90 tablet 0   ibuprofen (ADVIL,MOTRIN) 200 MG tablet Take 200 mg by mouth every 6 (six) hours as needed.     lisinopril (ZESTRIL) 10 MG tablet TAKE 1 TABLET BY MOUTH DAILY . NEEDS APPT FOR MORE REFILLS 90 tablet 0   metFORMIN (GLUCOPHAGE) 500 MG tablet TAKE TWO TABLETS BY MOUTH TWICE DAILY WITH A MEAL 360 tablet 1   Multiple Vitamins-Minerals (MULTIVITAMIN ADULT PO) Take by mouth daily.     omeprazole (PRILOSEC) 20 MG capsule Take 20 mg by mouth as needed.      rizatriptan (MAXALT-MLT) 10 MG disintegrating tablet TAKE 1 TABLET DISSOLVED ON TONGUE AS NEEDED FOR MIRGAINE MAY REPEAT IN 2 HOURS IF NEEDED NO MORE THAN 2 IN 24 HOURS 10 tablet 0   rosuvastatin (CRESTOR) 40 MG tablet TAKE 1 TABLET BY MOUTH DAILY 90 tablet 3   No current facility-administered medications on file prior to visit.     ROS see history of present illness  Objective  Physical Exam Vitals:   09/13/22 0907 09/13/22 0921  BP: 130/80  110/70  Pulse: 83   Temp: 98.5 F (36.9 C)   SpO2: 99%     BP Readings from Last 3 Encounters:  09/13/22 110/70  08/27/22 99/63  06/11/22 110/80   Wt Readings from Last 3 Encounters:  09/13/22 187 lb 3.2 oz (84.9 kg)  08/27/22 188 lb 4.8 oz (85.4 kg)  06/11/22 187 lb 9.6 oz (85.1 kg)    Physical Exam Constitutional:      General: She is not in acute distress.    Appearance: She is not diaphoretic.  Cardiovascular:     Rate and Rhythm: Normal rate and regular rhythm.     Heart sounds: Normal heart sounds.  Pulmonary:     Effort: Pulmonary effort is normal.     Breath sounds: Normal breath sounds.  Skin:    General: Skin is warm and dry.  Neurological:     Mental Status: She is alert.       Assessment/Plan: Please see individual problem list.  Problem List Items Addressed This Visit     Essential hypertension - Primary (Chronic)    Adequately controlled at home.  She will continue HCTZ 25 mg daily and lisinopril 10 mg daily.      Relevant Orders   Comp Met (CMET)   Lipid panel   Migraine (Chronic)    Infrequent in occurrence.  She can continue Maxalt 10 mg daily as needed for migraine with an additional dose 2 hours later if needed.      Type II diabetes mellitus (HCC) (Chronic)    Check A1c.  She will continue Farxiga 5 mg once daily and metformin 1000 mg twice daily.      Relevant Orders   Comp Met (CMET)   Lipid panel   HgB A1c   Bulky or enlarged uterus    She will have her ultrasound today through gynecology.      Rash and nonspecific skin eruption    Undetermined cause of rash.  Discussed if she has any recurrent she should take a picture and let us know.      Urinary urgency    Possible UTI related or overactive bladder related.  We will check a urinalysis today.  Discussed timed voiding.  Discussed medication for overactive bladder would not be indicated at this time given her mild symptoms.      Relevant Orders   POCT Urinalysis Dipstick     Health Maintenance: Patient notes that she has her colonoscopy scheduled for December though she is going to reschedule it for January given insurance purposes.  Return in about 6 months (around 03/14/2023).   Tommi Rumps, MD [place]

## 2022-09-16 ENCOUNTER — Ambulatory Visit
Admission: RE | Admit: 2022-09-16 | Discharge: 2022-09-16 | Disposition: A | Discharge status: Home / Self Care | Payer: BC Managed Care – PPO | Source: Ambulatory Visit | Attending: Family Medicine | Admitting: Family Medicine

## 2022-09-16 DIAGNOSIS — Z1231 Encounter for screening mammogram for malignant neoplasm of breast: Secondary | ICD-10-CM | POA: Insufficient documentation

## 2022-09-19 DIAGNOSIS — R051 Acute cough: Secondary | ICD-10-CM | POA: Diagnosis not present

## 2022-09-19 DIAGNOSIS — J069 Acute upper respiratory infection, unspecified: Secondary | ICD-10-CM | POA: Diagnosis not present

## 2022-09-19 DIAGNOSIS — Z03818 Encounter for observation for suspected exposure to other biological agents ruled out: Secondary | ICD-10-CM | POA: Diagnosis not present

## 2022-11-08 ENCOUNTER — Telehealth: Payer: Self-pay | Admitting: Obstetrics and Gynecology

## 2022-11-08 ENCOUNTER — Ambulatory Visit
Admission: RE | Admit: 2022-11-08 | Discharge: 2022-11-08 | Disposition: A | Discharge status: Home / Self Care | Payer: BC Managed Care – PPO | Source: Ambulatory Visit | Attending: Obstetrics and Gynecology | Admitting: Obstetrics and Gynecology

## 2022-11-08 DIAGNOSIS — Z780 Asymptomatic menopausal state: Secondary | ICD-10-CM | POA: Diagnosis not present

## 2022-11-08 DIAGNOSIS — Z7689 Persons encountering health services in other specified circumstances: Secondary | ICD-10-CM | POA: Diagnosis not present

## 2022-11-08 NOTE — Telephone Encounter (Signed)
I contacted [employer] for Pre authorization for Dexa Bone Density. Calling [phone].I selected prompt for Radiology. I entered on NPI as order provider [id number] and enter Member ID [id number]. I was rerouted to  call [phone] where I spoke with two representatives with [employer]. Where I provider CPT code [phone]. We the advised that no pre- authorization was required thru [employer] to call patient's member services on the back of the card. Jeanene Erb [phone] member services, responded provider services, provided member provider ID and member ID with Members date of birth, selected radiology. I spoke with them routed to call [phone]. Which started the process again to speak with a rep from [employer] because the provider number is no work as of 2022.

## 2022-11-08 NOTE — Telephone Encounter (Signed)
I contacted patient and made her aware of my attempt to get her scan pre authorized. I explain what info was provided for me and what was explained by the two rep's that the pre authorization with CPT code [phone] didn't need pre authorization. I told her I have done pre-authorization before but never has then had of a time trying to get this pre authoization and receive and reference number. Patient states she plans to go get imaging done. And apologized to me in my efforts that I was sent round and round about.

## 2022-11-19 ENCOUNTER — Other Ambulatory Visit: Payer: Self-pay | Admitting: Family Medicine

## 2022-11-19 DIAGNOSIS — I10 Essential (primary) hypertension: Secondary | ICD-10-CM

## 2022-11-19 DIAGNOSIS — E785 Hyperlipidemia, unspecified: Secondary | ICD-10-CM

## 2022-11-25 DIAGNOSIS — E119 Type 2 diabetes mellitus without complications: Secondary | ICD-10-CM | POA: Diagnosis not present

## 2022-12-07 DIAGNOSIS — J019 Acute sinusitis, unspecified: Secondary | ICD-10-CM | POA: Diagnosis not present

## 2022-12-07 DIAGNOSIS — Z03818 Encounter for observation for suspected exposure to other biological agents ruled out: Secondary | ICD-10-CM | POA: Diagnosis not present

## 2023-01-31 ENCOUNTER — Other Ambulatory Visit: Payer: Self-pay | Admitting: Family Medicine

## 2023-03-10 ENCOUNTER — Other Ambulatory Visit: Payer: Self-pay | Admitting: Family Medicine

## 2023-03-10 ENCOUNTER — Other Ambulatory Visit: Payer: Self-pay

## 2023-03-10 MED ORDER — DAPAGLIFLOZIN PROPANEDIOL 5 MG PO TABS: ORAL_TABLET | ORAL | 1 refills | Status: DC

## 2023-03-14 ENCOUNTER — Ambulatory Visit: Payer: BC Managed Care – PPO | Admitting: Family Medicine

## 2023-03-14 ENCOUNTER — Encounter: Payer: Self-pay | Admitting: Family Medicine

## 2023-03-14 VITALS — BP 126/76 | HR 92 | Temp 98.5°F | Ht 64.0 in | Wt 188.2 lb

## 2023-03-14 DIAGNOSIS — E119 Type 2 diabetes mellitus without complications: Secondary | ICD-10-CM | POA: Diagnosis not present

## 2023-03-14 DIAGNOSIS — I10 Essential (primary) hypertension: Secondary | ICD-10-CM

## 2023-03-14 DIAGNOSIS — Z7984 Long term (current) use of oral hypoglycemic drugs: Secondary | ICD-10-CM

## 2023-03-14 DIAGNOSIS — E785 Hyperlipidemia, unspecified: Secondary | ICD-10-CM | POA: Diagnosis not present

## 2023-03-14 DIAGNOSIS — F439 Reaction to severe stress, unspecified: Secondary | ICD-10-CM

## 2023-03-14 MED ORDER — HYDROCHLOROTHIAZIDE 25 MG PO TABS: 25.0000 mg | ORAL_TABLET | Freq: Every day | ORAL | 3 refills | Status: DC

## 2023-03-14 MED ORDER — LISINOPRIL 10 MG PO TABS: 10.0000 mg | ORAL_TABLET | Freq: Every day | ORAL | 3 refills | Status: DC

## 2023-03-14 NOTE — Assessment & Plan Note (Signed)
Chronic issue.  Adequately controlled.  Continue lisinopril 10 mg daily and HCTZ 25 mg daily.  Check BMP.

## 2023-03-14 NOTE — Assessment & Plan Note (Signed)
Chronic issue.  Check A1c.  Continue Farxiga 5 mg daily and metformin 1000 mg twice daily.

## 2023-03-14 NOTE — Patient Instructions (Signed)
Please call GI to reschedule your colonoscopy.

## 2023-03-14 NOTE — Assessment & Plan Note (Signed)
Patient will monitor.  If this progresses to anxiety or depression she will let us know.

## 2023-03-14 NOTE — Progress Notes (Signed)
Marikay Alar, MD Phone: [phone]  Kathryn Henderson is a 57 y.o. female who presents today for f/u.  HYPERTENSION Disease Monitoring: Blood pressure range-<130/80 Chest pain- no      Dyspnea- no Medications: Compliance- taking HCTZ, lisinopril Edema- no  DIABETES Disease Monitoring: Blood Sugar ranges-"good" though not consistently checking Polyuria/phagia/dipsia- no      Optho- UTD Medications: Compliance- taking farxiga, metformin Hypoglycemic symptoms- no  HYPERLIPIDEMIA Disease Monitoring: See symptoms for Hypertension Medications: Compliance- taking crestor Right upper quadrant pain- no  Muscle aches- no  Stress: Patient has a lot of stress at work.  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  There have been things there that have added to her stress level.  When asked about anxiety and depression she notes intense irritation though does not endorse anxiety or depression with this.   Social History   Tobacco Use  Smoking Status Never  Smokeless Tobacco Never    Current Outpatient Medications on File Prior to Visit  Medication Sig Dispense Refill   aspirin-acetaminophen-caffeine (EXCEDRIN MIGRAINE) 250-250-65 MG per tablet Take by mouth every 6 (six) hours as needed for headache.     calcium carbonate (OSCAL) 1500 (600 Ca) MG TABS tablet Take 1,500 mg by mouth 2 (two) times daily with a meal.     cetirizine (ZYRTEC) 10 MG tablet Take 10 mg by mouth daily.     dapagliflozin propanediol (FARXIGA) 5 MG TABS tablet TAKE 1 TABLET BY MOUTH ONCE DAILY BEFOREBREAKFAST 90 tablet 1   FARXIGA 5 MG TABS tablet TAKE 1 TABLET BY MOUTH ONCE DAILY BEFOREBREAKFAST 90 tablet 1   fluticasone (FLONASE) 50 MCG/ACT nasal spray Place 1 spray into both nostrils daily.     ibuprofen (ADVIL,MOTRIN) 200 MG tablet Take 200 mg by mouth every 6 (six) hours as needed.     metFORMIN (GLUCOPHAGE) 500 MG tablet TAKE TWO TABLETS BY  MOUTH TWICE DAILY WITH A MEAL 360 tablet 1   Multiple Vitamins-Minerals (MULTIVITAMIN ADULT PO) Take by mouth daily.     omeprazole (PRILOSEC) 20 MG capsule Take 20 mg by mouth as needed.      rizatriptan (MAXALT-MLT) 10 MG disintegrating tablet TAKE 1 TABLET DISSOLVED ON TONGUE AS NEEDED FOR MIRGAINE MAY REPEAT IN 2 HOURS IF NEEDED NO MORE THAN 2 IN 24 HOURS 10 tablet 0   rosuvastatin (CRESTOR) 40 MG tablet TAKE 1 TABLET BY MOUTH DAILY 90 tablet 3   No current facility-administered medications on file prior to visit.     ROS see history of present illness  Objective  Physical Exam Vitals:   03/14/23 1624 03/14/23 1648  BP: 134/86 126/76  Pulse: 92   Temp: 98.5 F (36.9 C)   SpO2: 96%     BP Readings from Last 3 Encounters:  03/14/23 126/76  09/13/22 110/70  08/27/22 99/63   Wt Readings from Last 3 Encounters:  03/14/23 188 lb 3.2 oz (85.4 kg)  09/13/22 187 lb 3.2 oz (84.9 kg)  08/27/22 188 lb 4.8 oz (85.4 kg)    Physical Exam Constitutional:      General: She is not in acute distress.    Appearance: She is not diaphoretic.  Cardiovascular:     Rate and Rhythm: Normal rate and regular rhythm.     Heart sounds: Normal heart sounds.  Pulmonary:     Effort: Pulmonary effort is normal.     Breath sounds: Normal breath sounds.  Musculoskeletal:  Right lower leg: No edema.     Left lower leg: No edema.  Skin:    General: Skin is warm and dry.  Neurological:     Mental Status: She is alert.    Diabetic Foot Exam - Simple   Simple Foot Form Diabetic Foot exam was performed with the following findings: Yes 03/14/2023  4:49 PM  Visual Inspection No deformities, no ulcerations, no other skin breakdown bilaterally: Yes Sensation Testing Intact to touch and monofilament testing bilaterally: Yes Pulse Check Posterior Tibialis and Dorsalis pulse intact bilaterally: Yes Comments      Assessment/Plan: Please see individual problem list.  Type 2 diabetes mellitus  without complication, without long-term current use of insulin (HCC) Assessment & Plan: Chronic issue.  Check A1c.  Continue Farxiga 5 mg daily and metformin 1000 mg twice daily.  Orders: -     Hemoglobin A1c  Essential hypertension Assessment & Plan: Chronic issue.  Adequately controlled.  Continue lisinopril 10 mg daily and HCTZ 25 mg daily.  Check BMP.  Orders: -     Lisinopril; Take 1 tablet (10 mg total) by mouth daily.  Dispense: 90 tablet; Refill: 3 -     hydroCHLOROthiazide; Take 1 tablet (25 mg total) by mouth daily.  Dispense: 90 tablet; Refill: 3 -     Basic metabolic panel  Hyperlipidemia, unspecified hyperlipidemia type Assessment & Plan: Chronic issue.  Continue Crestor 40 mg daily.   Stress Assessment & Plan: Patient will monitor.  If this progresses to anxiety or depression she will let us know.     Return in about 6 months (around 09/14/2023) for physical.   Marikay Alar, MD [facility]

## 2023-03-14 NOTE — Assessment & Plan Note (Signed)
Chronic issue.  Continue Crestor 40 mg daily. 

## 2023-03-15 LAB — BASIC METABOLIC PANEL
BUN: 16 mg/dL (ref 6–23)
CO2: 27 mEq/L (ref 19–32)
Calcium: 9.8 mg/dL (ref 8.4–10.5)
Chloride: 101 mEq/L (ref 96–112)
Creatinine, Ser: 0.65 mg/dL (ref 0.40–1.20)
GFR: 98.25 mL/min (ref >60.00)
Glucose, Bld: 152 mg/dL — ABNORMAL HIGH (ref 70–99)
Potassium: 3.9 mEq/L (ref 3.5–5.1)
Sodium: 138 mEq/L (ref 135–145)

## 2023-03-15 LAB — HEMOGLOBIN A1C: Hgb A1c MFr Bld: 6.7 % — ABNORMAL HIGH (ref 4.6–6.5)

## 2023-04-19 DIAGNOSIS — S20462A Insect bite (nonvenomous) of left back wall of thorax, initial encounter: Secondary | ICD-10-CM | POA: Diagnosis not present

## 2023-04-19 DIAGNOSIS — W57XXXA Bitten or stung by nonvenomous insect and other nonvenomous arthropods, initial encounter: Secondary | ICD-10-CM | POA: Diagnosis not present

## 2023-06-07 ENCOUNTER — Telehealth: Payer: Self-pay | Admitting: Family Medicine

## 2023-06-07 NOTE — Telephone Encounter (Signed)
Called Patient regarding Dr. Purvis Sheffield message below.

## 2023-06-07 NOTE — Telephone Encounter (Signed)
Please call the patient.  I just got a reminder by our electronic medical record system that she did not have the ultrasound that I ordered to look at her uterus last year.  Based on her physical exam last year she should have this completed.  Is she willing to have the ultrasound done?

## 2023-06-08 ENCOUNTER — Encounter: Payer: Self-pay | Admitting: Family Medicine

## 2023-06-08 ENCOUNTER — Telehealth: Payer: BC Managed Care – PPO | Admitting: Family Medicine

## 2023-06-08 VITALS — Temp 100.8°F | Ht 63.75 in | Wt 195.0 lb

## 2023-06-08 DIAGNOSIS — U071 COVID-19: Secondary | ICD-10-CM | POA: Diagnosis not present

## 2023-06-08 MED ORDER — NIRMATRELVIR/RITONAVIR (PAXLOVID)TABLET
3.0000 | ORAL_TABLET | Freq: Two times a day (BID) | ORAL | 0 refills | Status: AC
Start: 2023-06-08 — End: 2023-06-13

## 2023-06-08 MED ORDER — BENZONATATE 200 MG PO CAPS: 200.0000 mg | ORAL_CAPSULE | Freq: Two times a day (BID) | ORAL | 0 refills | Status: DC | PRN

## 2023-06-08 NOTE — Progress Notes (Signed)
Virtual Visit via Video note  I connected with Kathryn Henderson on 06/25/23 at 1540 by video and verified that I am speaking with the correct person using two identifiers. Kathryn Henderson is currently located at home and is currently with alone during visit. The provider, Dana Allan, MD is located in their office at time of visit.  I discussed the limitations, risks, security and privacy concerns of performing an evaluation and management service by video and the availability of in person appointments. I also discussed with the patient that there may be a patient responsible charge related to this service. The patient expressed understanding and agreed to proceed.  Subjective: PCP: Glori Luis, MD  Chief Complaint  Patient presents with   Covid Positive    Tested + 1 hour ago. Fever, sore throat, cough.    HPI Symptoms started yesterday evening. Sore throat, subjective fever overnight, nasal congestion, sinus tenderness, fatigued.  Works at senior [employer].  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  No direct contact with coworker but sharing same clients.  Took COVID test yesterday negative.  Repeat test today positive.  Has had 8 vaccinations.  Would like antiviral treatment.   ROS: Per HPI  Current Outpatient Medications:    aspirin-acetaminophen-caffeine (EXCEDRIN MIGRAINE) 250-250-65 MG per tablet, Take by mouth every 6 (six) hours as needed for headache., Disp: , Rfl:    benzonatate (TESSALON) 200 MG capsule, Take 1 capsule (200 mg total) by mouth 2 (two) times daily as needed for cough., Disp: 20 capsule, Rfl: 0   calcium carbonate (OSCAL) 1500 (600 Ca) MG TABS tablet, Take 1,500 mg by mouth 2 (two) times daily with a meal., Disp: , Rfl:    cetirizine (ZYRTEC) 10 MG tablet, Take 10 mg by mouth daily., Disp: , Rfl:    dapagliflozin propanediol (FARXIGA) 5 MG TABS tablet, TAKE 1 TABLET BY MOUTH ONCE DAILY BEFOREBREAKFAST, Disp: 90 tablet, Rfl: 1   FARXIGA 5 MG TABS tablet, TAKE 1 TABLET BY  MOUTH ONCE DAILY BEFOREBREAKFAST, Disp: 90 tablet, Rfl: 1   fluticasone (FLONASE) 50 MCG/ACT nasal spray, Place 1 spray into both nostrils daily., Disp: , Rfl:    hydrochlorothiazide (HYDRODIURIL) 25 MG tablet, Take 1 tablet (25 mg total) by mouth daily., Disp: 90 tablet, Rfl: 3   ibuprofen (ADVIL,MOTRIN) 200 MG tablet, Take 200 mg by mouth every 6 (six) hours as needed., Disp: , Rfl:    lisinopril (ZESTRIL) 10 MG tablet, Take 1 tablet (10 mg total) by mouth daily., Disp: 90 tablet, Rfl: 3   metFORMIN (GLUCOPHAGE) 500 MG tablet, TAKE TWO TABLETS BY MOUTH TWICE DAILY WITH A MEAL, Disp: 360 tablet, Rfl: 1   Multiple Vitamins-Minerals (MULTIVITAMIN ADULT PO), Take by mouth daily., Disp: , Rfl:    omeprazole (PRILOSEC) 20 MG capsule, Take 20 mg by mouth as needed. , Disp: , Rfl:    rizatriptan (MAXALT-MLT) 10 MG disintegrating tablet, TAKE 1 TABLET DISSOLVED ON TONGUE AS NEEDED FOR MIRGAINE MAY REPEAT IN 2 HOURS IF NEEDED NO MORE THAN 2 IN 24 HOURS, Disp: 10 tablet, Rfl: 0   rosuvastatin (CRESTOR) 40 MG tablet, TAKE 1 TABLET BY MOUTH DAILY, Disp: 90 tablet, Rfl: 3  Observations/Objective: Physical Exam Pulmonary:     Effort: Pulmonary effort is normal.  Neurological:     Mental Status: She is alert and oriented to person, place, and time. Mental status is at baseline.  Psychiatric:        Mood and Affect: Mood normal.  Behavior: Behavior normal.        Thought Content: Thought content normal.        Judgment: Judgment normal.    Assessment and Plan: Positive self-administered antigen test for COVID-19 Assessment & Plan: In no acute respiratory distress COVID test positive Symptomatic management Start Paxlovid x 5 days Strict COVID precautions provided Strict ED precautions provided   Orders: -     nirmatrelvir/ritonavir; Take 3 tablets by mouth 2 (two) times daily for 5 days. (Take nirmatrelvir 150 mg two tablets twice daily for 5 days and ritonavir 100 mg one tablet twice daily  for 5 days) Patient GFR is 98.25  Dispense: 30 tablet; Refill: 0 -     Benzonatate; Take 1 capsule (200 mg total) by mouth 2 (two) times daily as needed for cough.  Dispense: 20 capsule; Refill: 0    Follow Up Instructions: Return if symptoms worsen or fail to improve, for PCP.   I discussed the assessment and treatment plan with the patient. The patient was provided an opportunity to ask questions and all were answered. The patient agreed with the plan and demonstrated an understanding of the instructions.   The patient was advised to call back or seek an in-person evaluation if the symptoms worsen or if the condition fails to improve as anticipated.  The above assessment and management plan was discussed with the patient. The patient verbalized understanding of and has agreed to the management plan. Patient is aware to call the clinic if symptoms persist or worsen. Patient is aware when to return to the clinic for a follow-up visit. Patient educated on when it is appropriate to go to the emergency department.     Dana Allan, MD

## 2023-06-08 NOTE — Patient Instructions (Addendum)
It was a pleasure meeting you today. Thank you for allowing me to take part in your health care.  Our goals for today as we discussed include:  Start Paxlovid two times a day for 5 days  Symptomatic management for fever, muscle aches and headaches  -Tylenol 325-500 mg every 6 hours as needed -Ibuprofen 200 mg every 8 hours as needed  Stay well hydrated Rest as needed with frequent repositioning and ambulation.  Increase activity as soon as tolerated to help with recovery.  Continue wearing masks, hand washing and self isolation until symptom free.  If you have worsening symptoms, especially difficulty breathing please call 911 or have someone take you to the emergency department.    Follow up as needed  If you have any questions or concerns, please do not hesitate to call the office at [phone].  I look forward to our next visit and until then take care and stay safe.  Regards,   Dana Allan, MD   [facility]

## 2023-06-08 NOTE — Telephone Encounter (Signed)
Pt called back and I read the note to her and she stated she has already did the ultrasound of her uterus last year and the results should be in [place]

## 2023-06-10 ENCOUNTER — Other Ambulatory Visit (HOSPITAL_COMMUNITY): Payer: Self-pay

## 2023-06-10 NOTE — Telephone Encounter (Signed)
Noted. I see the results now.

## 2023-06-25 ENCOUNTER — Encounter: Payer: Self-pay | Admitting: Family Medicine

## 2023-06-25 DIAGNOSIS — U071 COVID-19: Secondary | ICD-10-CM | POA: Insufficient documentation

## 2023-06-25 NOTE — Assessment & Plan Note (Addendum)
In no acute respiratory distress COVID test positive Symptomatic management Start Paxlovid x 5 days Strict COVID precautions provided Strict ED precautions provided

## 2023-09-14 ENCOUNTER — Encounter: Payer: BC Managed Care – PPO | Admitting: Family Medicine

## 2023-09-21 DIAGNOSIS — E119 Type 2 diabetes mellitus without complications: Secondary | ICD-10-CM | POA: Diagnosis not present

## 2023-09-21 LAB — HM DIABETES EYE EXAM

## 2023-09-28 ENCOUNTER — Ambulatory Visit (INDEPENDENT_AMBULATORY_CARE_PROVIDER_SITE_OTHER): Payer: BC Managed Care – PPO | Admitting: Family Medicine

## 2023-09-28 ENCOUNTER — Encounter: Payer: Self-pay | Admitting: Family Medicine

## 2023-09-28 VITALS — BP 118/78 | HR 88 | Temp 98.5°F | Ht 63.75 in | Wt 185.4 lb

## 2023-09-28 DIAGNOSIS — Z136 Encounter for screening for cardiovascular disorders: Secondary | ICD-10-CM

## 2023-09-28 DIAGNOSIS — Z0001 Encounter for general adult medical examination with abnormal findings: Secondary | ICD-10-CM | POA: Insufficient documentation

## 2023-09-28 DIAGNOSIS — Z1329 Encounter for screening for other suspected endocrine disorder: Secondary | ICD-10-CM

## 2023-09-28 DIAGNOSIS — E119 Type 2 diabetes mellitus without complications: Secondary | ICD-10-CM | POA: Diagnosis not present

## 2023-09-28 DIAGNOSIS — E785 Hyperlipidemia, unspecified: Secondary | ICD-10-CM | POA: Diagnosis not present

## 2023-09-28 DIAGNOSIS — Z130 Encounter for screening for diseases of the blood and blood-forming organs and certain disorders involving the immune mechanism: Secondary | ICD-10-CM

## 2023-09-28 DIAGNOSIS — F439 Reaction to severe stress, unspecified: Secondary | ICD-10-CM

## 2023-09-28 DIAGNOSIS — Z1231 Encounter for screening mammogram for malignant neoplasm of breast: Secondary | ICD-10-CM

## 2023-09-28 DIAGNOSIS — Z1211 Encounter for screening for malignant neoplasm of colon: Secondary | ICD-10-CM

## 2023-09-28 DIAGNOSIS — Z0000 Encounter for general adult medical examination without abnormal findings: Secondary | ICD-10-CM | POA: Diagnosis not present

## 2023-09-28 LAB — CBC
HCT: 41.8 % (ref 36.0–46.0)
Hemoglobin: 13.7 g/dL (ref 12.0–15.0)
MCHC: 32.7 g/dL (ref 30.0–36.0)
MCV: 83.2 fL (ref 78.0–100.0)
Platelets: 271 10*3/uL (ref 150.0–400.0)
RBC: 5.02 Mil/uL (ref 3.87–5.11)
RDW: 14.8 % (ref 11.5–15.5)
WBC: 5.7 10*3/uL (ref 4.0–10.5)

## 2023-09-28 LAB — LIPID PANEL
Cholesterol: 116 mg/dL (ref 0–200)
HDL: 43.6 mg/dL (ref >39.00)
LDL Cholesterol: 53 mg/dL (ref 0–99)
NonHDL: 72.76
Total CHOL/HDL Ratio: 3
Triglycerides: 99 mg/dL (ref 0.0–149.0)
VLDL: 19.8 mg/dL (ref 0.0–40.0)

## 2023-09-28 LAB — TSH: TSH: 1.83 u[IU]/mL (ref 0.35–5.50)

## 2023-09-28 LAB — COMPREHENSIVE METABOLIC PANEL
ALT: 13 U/L (ref 0–35)
AST: 18 U/L (ref 0–37)
Albumin: 4.7 g/dL (ref 3.5–5.2)
Alkaline Phosphatase: 85 U/L (ref 39–117)
BUN: 18 mg/dL (ref 6–23)
CO2: 28 meq/L (ref 19–32)
Calcium: 9.9 mg/dL (ref 8.4–10.5)
Chloride: 101 meq/L (ref 96–112)
Creatinine, Ser: 0.56 mg/dL (ref 0.40–1.20)
GFR: 101.46 mL/min (ref >60.00)
Glucose, Bld: 145 mg/dL — ABNORMAL HIGH (ref 70–99)
Potassium: 4.4 meq/L (ref 3.5–5.1)
Sodium: 138 meq/L (ref 135–145)
Total Bilirubin: 1.2 mg/dL (ref 0.2–1.2)
Total Protein: 7.5 g/dL (ref 6.0–8.3)

## 2023-09-28 LAB — HEMOGLOBIN A1C: Hgb A1c MFr Bld: 6.7 % — ABNORMAL HIGH (ref 4.6–6.5)

## 2023-09-28 NOTE — Assessment & Plan Note (Signed)
Physical exam completed.  Encouraged healthy diet and increasing her exercise.  Pap smear is up-to-date.  Refer for colonoscopy.  Mammogram order placed and she knows she needs to call to get this scheduled.  Patient noted to have borderline thickened endometrium on ultrasound last year with gynecology.  Note sent to gynecology to make sure she does not need any follow-up for this.  Encouraged her to see the dentist.  Lab work as outlined.

## 2023-09-28 NOTE — Patient Instructions (Signed)
Nice to see you. Please call [phone] to schedule your mammogram.  We will contact you with your lab results.
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
## 2023-09-28 NOTE — Assessment & Plan Note (Signed)
Discussed trying to increase her exercises that could be helpful.  Discussed trying to find things to do for herself that she enjoys.  Discussed therapy and medication options for anxiety and she will let us know if she would like to proceed with those at any point in the future.

## 2023-10-03 ENCOUNTER — Telehealth: Payer: Self-pay

## 2023-10-03 NOTE — Telephone Encounter (Signed)
-----   Message from Marikay Alar sent at 10/03/2023  8:15 AM EST ----- Please relay the mychart result note to the patient. Thanks.

## 2023-10-03 NOTE — Telephone Encounter (Signed)
Pt called back returning Elizabeth's call regarding results. Pt mentioned she already saw Dr. Purvis Sheffield response. Call back # [phone]

## 2023-10-03 NOTE — Telephone Encounter (Signed)
Left message to call the office back regarding a my chart message from DR. Sonnenberg.

## 2023-11-04 ENCOUNTER — Other Ambulatory Visit: Payer: Self-pay | Admitting: Family Medicine

## 2023-12-05 ENCOUNTER — Ambulatory Visit: Payer: Self-pay | Admitting: Family Medicine

## 2023-12-05 NOTE — Telephone Encounter (Signed)
Patient needs an appointment to evaluate further and discuss treatment options.

## 2023-12-05 NOTE — Telephone Encounter (Signed)
Chief Complaint: Flu exposure Symptoms: cough, mild boy aches, drainage Frequency: yesterday  Pertinent Negatives: Patient denies fever Disposition: [] ED /[] Urgent Care (no appt availability in office) / [] Appointment(In office/virtual)/ []  [facility]/ [x] Home Care/ [] Refused Recommended Disposition /[] [facility]/ []  Follow-up with PCP Additional Notes: Patient called in stating she was recently exposed to the flu because of being with her mother and sister Thursday-Sunday who were confirmed positive for the flu. Patient states as of last night she has developed a cough, mild body aches and drainage. Patient denies a fever. Patient states she is unable to take liquid medication due to gag reflex and is asking for Benzonatate (tessalon pearls) to be called in to [facility] listed. Patient will treat other symptoms at home. Please advise.    Reason for Disposition  Influenza and antiviral drugs, questions about  Answer Assessment - Initial Assessment Questions 1. TYPE of EXPOSURE: "How were you exposed?" (e.g., close contact, not a close contact)     Close contact with mother and sister who were confirmed positive for Influenza A 2. DATE of EXPOSURE: "When did the exposure occur?" (e.g., hour, days, weeks)     Thursday - Sunday 4. HIGH RISK for COMPLICATIONS: "Do you have any heart or lung problems?" "Do you have a weakened immune system?" (e.g., CHF, COPD, asthma, HIV positive, chemotherapy, renal failure, diabetes mellitus, sickle cell anemia)     Diabetic 5. SYMPTOMS: "Do you have any symptoms?" (e.g., cough, fever, sore throat, difficulty breathing).     Cough, mild body aches, post nasal drainage, blow nose is clear  Protocols used: Influenza (Flu) Exposure-A-AH

## 2023-12-05 NOTE — Telephone Encounter (Signed)
Pt has been scheduled to see you tomorrow at 1000

## 2023-12-06 ENCOUNTER — Ambulatory Visit: Payer: BC Managed Care – PPO | Admitting: Family Medicine

## 2023-12-06 ENCOUNTER — Encounter: Payer: Self-pay | Admitting: Family Medicine

## 2023-12-06 VITALS — BP 120/70 | HR 112 | Temp 99.6°F | Ht 63.75 in | Wt 184.0 lb

## 2023-12-06 DIAGNOSIS — J101 Influenza due to other identified influenza virus with other respiratory manifestations: Secondary | ICD-10-CM | POA: Diagnosis not present

## 2023-12-06 DIAGNOSIS — R6889 Other general symptoms and signs: Secondary | ICD-10-CM

## 2023-12-06 LAB — POCT INFLUENZA A/B
Influenza A, POC: POSITIVE — AB
Influenza B, POC: NEGATIVE

## 2023-12-06 LAB — POC COVID19 BINAXNOW: SARS Coronavirus 2 Ag: NEGATIVE

## 2023-12-06 MED ORDER — OSELTAMIVIR PHOSPHATE 75 MG PO CAPS: 75.0000 mg | ORAL_CAPSULE | Freq: Two times a day (BID) | ORAL | 0 refills | Status: DC

## 2023-12-06 MED ORDER — BENZONATATE 200 MG PO CAPS: 200.0000 mg | ORAL_CAPSULE | Freq: Two times a day (BID) | ORAL | 0 refills | Status: DC | PRN

## 2023-12-06 NOTE — Progress Notes (Signed)
 Kathryn Her, MD Phone: [phone]  Kathryn Henderson is a 58 y.o. female who presents today for cough.  Onset of symptoms 2 days ago.  Developed sudden onset hard cough.  Has some head congestion.  Fever of 101.8 degrees in height.  Occasional shortness of breath with exertion.  No shortness of breath at rest.  Body aches and postnasal drip as well.  Initially had sore throat though that has resolved.  Notes vomiting on a couple of occasions and notes it was just the food that she had tried to eat.  She has been using Mucinex and Sudafed.  She does report flu exposure.  Social History   Tobacco Use  Smoking Status Never  Smokeless Tobacco Never    Current Outpatient Medications on File Prior to Visit  Medication Sig Dispense Refill   aspirin-acetaminophen-caffeine (EXCEDRIN MIGRAINE) 250-250-65 MG per tablet Take by mouth every 6 (six) hours as needed for headache.     calcium  carbonate (OSCAL) 1500 (600 Ca) MG TABS tablet Take 1,500 mg by mouth 2 (two) times daily with a meal.     cetirizine (ZYRTEC) 10 MG tablet Take 10 mg by mouth daily.     FARXIGA  5 MG TABS tablet TAKE 1 TABLET BY MOUTH ONCE DAILY BEFOREBREAKFAST 90 tablet 1   fluticasone (FLONASE) 50 MCG/ACT nasal spray Place 1 spray into both nostrils daily.     hydrochlorothiazide  (HYDRODIURIL ) 25 MG tablet Take 1 tablet (25 mg total) by mouth daily. 90 tablet 3   ibuprofen (ADVIL,MOTRIN) 200 MG tablet Take 200 mg by mouth every 6 (six) hours as needed.     lisinopril  (ZESTRIL ) 10 MG tablet Take 1 tablet (10 mg total) by mouth daily. 90 tablet 3   metFORMIN  (GLUCOPHAGE ) 500 MG tablet TAKE TWO TABLETS TWICE A DAY WITH MEALS 360 tablet 1   Multiple Vitamins-Minerals (MULTIVITAMIN ADULT PO) Take by mouth daily.     omeprazole (PRILOSEC) 20 MG capsule Take 20 mg by mouth as needed.      rizatriptan  (MAXALT -MLT) 10 MG disintegrating tablet TAKE 1 TABLET DISSOLVED ON TONGUE AS NEEDED FOR MIRGAINE MAY REPEAT IN 2 HOURS IF NEEDED NO  MORE THAN 2 IN 24 HOURS 10 tablet 0   rosuvastatin  (CRESTOR ) 40 MG tablet TAKE 1 TABLET BY MOUTH DAILY 90 tablet 3   No current facility-administered medications on file prior to visit.     ROS see history of present illness  Objective  Physical Exam Vitals:   12/06/23 1013  BP: 120/70  Pulse: (!) 112  Temp: 99.6 F (37.6 C)  SpO2: 96%    BP Readings from Last 3 Encounters:  12/06/23 120/70  09/28/23 118/78  03/14/23 126/76   Wt Readings from Last 3 Encounters:  12/06/23 184 lb (83.5 kg)  09/28/23 185 lb 6.4 oz (84.1 kg)  06/08/23 195 lb (88.5 kg)    Physical Exam Constitutional:      General: She is not in acute distress.    Appearance: She is not diaphoretic.  Cardiovascular:     Rate and Rhythm: Regular rhythm. Tachycardia present.     Heart sounds: Normal heart sounds.  Pulmonary:     Effort: Pulmonary effort is normal.     Breath sounds: Normal breath sounds.  Skin:    General: Skin is warm and dry.  Neurological:     Mental Status: She is alert.      Assessment/Plan: Please see individual problem list.  Influenza A Assessment & Plan: Patient with positive flu  A test.  Negative COVID test.  Symptoms are consistent with influenza and patient has systemic symptoms with fever and shortness of breath.  Will treat with Tamiflu  as prescribed.  Discussed the small risk of psychiatric issues and if she develops any psychiatric symptoms on the Tamiflu  she will discontinue use of it and let us  know.  Prescribed Tessalon  for cough.  If she develops shortness of breath that is worsening or any worsening symptoms she will be reevaluated right away.  She will have Henderson husband contact their doctor to discuss Tamiflu  prophylaxis.  Orders: -     Oseltamivir  Phosphate; Take 1 capsule (75 mg total) by mouth 2 (two) times daily.  Dispense: 10 capsule; Refill: 0 -     Benzonatate ; Take 1 capsule (200 mg total) by mouth 2 (two) times daily as needed for cough.  Dispense: 20  capsule; Refill: 0  Flu-like symptoms -     POC COVID-19 BinaxNow -     POCT Influenza A/B     Return if symptoms worsen or fail to improve.   Kathryn Her, MD [facility]

## 2023-12-06 NOTE — Assessment & Plan Note (Addendum)
 Patient with positive flu A test.  Negative COVID test.  Symptoms are consistent with influenza and patient has systemic symptoms with fever and shortness of breath.  Will treat with Tamiflu  as prescribed.  Discussed the small risk of psychiatric issues and if she develops any psychiatric symptoms on the Tamiflu  she will discontinue use of it and let us  know.  Prescribed Tessalon  for cough.  If she develops shortness of breath that is worsening or any worsening symptoms she will be reevaluated right away.  She will have her husband contact their doctor to discuss Tamiflu  prophylaxis.

## 2023-12-06 NOTE — Patient Instructions (Signed)
Nice to see you. We are treating you with Tamiflu for influenza. You can use the Tessalon for cough. If you develop worsening shortness of breath or any worsening symptoms please seek medical attention.

## 2023-12-19 ENCOUNTER — Other Ambulatory Visit: Payer: Self-pay | Admitting: Family Medicine

## 2023-12-19 DIAGNOSIS — E785 Hyperlipidemia, unspecified: Secondary | ICD-10-CM

## 2023-12-27 DIAGNOSIS — Z01818 Encounter for other preprocedural examination: Secondary | ICD-10-CM | POA: Diagnosis not present

## 2023-12-28 ENCOUNTER — Ambulatory Visit
Admission: RE | Admit: 2023-12-28 | Discharge: 2023-12-28 | Disposition: A | Discharge status: Home / Self Care | Payer: BC Managed Care – PPO | Source: Ambulatory Visit | Attending: Family Medicine | Admitting: Family Medicine

## 2023-12-28 DIAGNOSIS — Z1231 Encounter for screening mammogram for malignant neoplasm of breast: Secondary | ICD-10-CM | POA: Insufficient documentation

## 2023-12-30 ENCOUNTER — Encounter: Payer: Self-pay | Admitting: Nurse Practitioner

## 2024-02-03 ENCOUNTER — Ambulatory Visit (INDEPENDENT_AMBULATORY_CARE_PROVIDER_SITE_OTHER): Payer: Self-pay

## 2024-02-03 DIAGNOSIS — Z09 Encounter for follow-up examination after completed treatment for conditions other than malignant neoplasm: Secondary | ICD-10-CM | POA: Diagnosis not present

## 2024-02-03 DIAGNOSIS — Z860100 Personal history of colon polyps, unspecified: Secondary | ICD-10-CM | POA: Diagnosis not present

## 2024-02-03 DIAGNOSIS — D125 Benign neoplasm of sigmoid colon: Secondary | ICD-10-CM | POA: Diagnosis not present

## 2024-02-03 DIAGNOSIS — K640 First degree hemorrhoids: Secondary | ICD-10-CM | POA: Diagnosis not present

## 2024-03-15 ENCOUNTER — Other Ambulatory Visit: Payer: Self-pay | Admitting: Nurse Practitioner

## 2024-03-15 NOTE — Telephone Encounter (Unsigned)
 Copied from CRM [id number]. Topic: Clinical - Medication Refill >> Mar 15, 2024  9:40 AM Abigail D wrote: Medication: FARXIGA  5 MG TABS tablet  Has the patient contacted their pharmacy? Yes (Agent: If no, request that the patient contact the pharmacy for the refill. If patient does not wish to contact the pharmacy document the reason why and proceed with request.) (Agent: If yes, when and what did the pharmacy advise?)  This is the patient's preferred pharmacy:  [facility] - [place], [place] - [address] [place] [address] Phone: [phone] Fax: [phone]  Is this the correct pharmacy for this prescription? Yes If no, delete pharmacy and type the correct one.   Has the prescription been filled recently? Yes  Is the patient out of the medication? No  Has the patient been seen for an appointment in the last year OR does the patient have an upcoming appointment? No  Can we respond through MyChart? Yes  Agent: Please be advised that Rx refills may take up to 3 business days. We ask that you follow-up with your pharmacy.

## 2024-03-16 MED ORDER — DAPAGLIFLOZIN PROPANEDIOL 5 MG PO TABS: 5.0000 mg | ORAL_TABLET | Freq: Every day | ORAL | 0 refills | Status: DC

## 2024-03-19 ENCOUNTER — Other Ambulatory Visit: Payer: Self-pay | Admitting: Nurse Practitioner

## 2024-03-19 ENCOUNTER — Telehealth: Payer: Self-pay | Admitting: Nurse Practitioner

## 2024-03-19 NOTE — Telephone Encounter (Signed)
 Copied from CRM [id number]. Topic: Clinical - Medication Refill >> Mar 19, 2024  8:29 AM Trula Gable C wrote: Medication: dapagliflozin  propanediol (FARXIGA ) 5 MG TABS tablet   Has the patient contacted their pharmacy? Yes (Agent: If no, request that the patient contact the pharmacy for the refill. If patient does not wish to contact the pharmacy document the reason why and proceed with request.) (Agent: If yes, when and what did the pharmacy advise?)  This is the patient's preferred pharmacy:   [id number] [place], [place] - [address] [place] [address] Phone: [phone] Fax: [phone]  Is this the correct pharmacy for this prescription? Yes If no, delete pharmacy and type the correct one.   Has the prescription been filled recently? No  Is the patient out of the medication? Yes  Has the patient been seen for an appointment in the last year OR does the patient have an upcoming appointment? Yes  Can we respond through MyChart? Yes  Agent: Please be advised that Rx refills may take up to 3 business days. We ask that you follow-up with your pharmacy.

## 2024-03-19 NOTE — Telephone Encounter (Signed)
 Refilled 3 days ago. Is it okay to refuse the duplicate request?

## 2024-03-20 ENCOUNTER — Other Ambulatory Visit: Payer: Self-pay | Admitting: Nurse Practitioner

## 2024-03-20 MED ORDER — DAPAGLIFLOZIN PROPANEDIOL 5 MG PO TABS: 5.0000 mg | ORAL_TABLET | Freq: Every day | ORAL | 0 refills | Status: DC

## 2024-03-20 NOTE — Telephone Encounter (Signed)
 Appointment has been scheduled.     Copied from CRM [id number]. Topic: Clinical - Medication Refill >> Mar 19, 2024  8:29 AM Trula Gable C wrote: Medication: dapagliflozin  propanediol (FARXIGA ) 5 MG TABS tablet   Has the patient contacted their pharmacy? Yes (Agent: If no, request that the patient contact the pharmacy for the refill. If patient does not wish to contact the pharmacy document the reason why and proceed with request.) (Agent: If yes, when and what did the pharmacy advise?)  This is the patient's preferred pharmacy:   [id number] [place], [place] - [address] [place] [address] Phone: [phone] Fax: [phone]  Is this the correct pharmacy for this prescription? Yes If no, delete pharmacy and type the correct one.   Has the prescription been filled recently? No  Is the patient out of the medication? Yes  Has the patient been seen for an appointment in the last year OR does the patient have an upcoming appointment? Yes  Can we respond through MyChart? Yes  Agent: Please be advised that Rx refills may take up to 3 business days. We ask that you follow-up with your pharmacy.   Note >> Mar 20, 2024 10:10 AM Felizardo Hotter wrote: Pt called stated pharmacy denied refill. Pt has scheduled appt on 03/27/2024 with Bluford Burkitt NP and is out of medication. Pt would like a call back at [phone]

## 2024-03-27 ENCOUNTER — Encounter: Payer: Self-pay | Admitting: Nurse Practitioner

## 2024-03-27 ENCOUNTER — Ambulatory Visit: Payer: Self-pay | Admitting: Nurse Practitioner

## 2024-03-27 ENCOUNTER — Ambulatory Visit: Payer: BC Managed Care – PPO | Admitting: Nurse Practitioner

## 2024-03-27 VITALS — BP 110/78 | HR 87 | Temp 98.3°F | Ht 63.75 in | Wt 189.6 lb

## 2024-03-27 DIAGNOSIS — K219 Gastro-esophageal reflux disease without esophagitis: Secondary | ICD-10-CM

## 2024-03-27 DIAGNOSIS — Z7984 Long term (current) use of oral hypoglycemic drugs: Secondary | ICD-10-CM | POA: Diagnosis not present

## 2024-03-27 DIAGNOSIS — E785 Hyperlipidemia, unspecified: Secondary | ICD-10-CM

## 2024-03-27 DIAGNOSIS — E119 Type 2 diabetes mellitus without complications: Secondary | ICD-10-CM | POA: Diagnosis not present

## 2024-03-27 DIAGNOSIS — G43109 Migraine with aura, not intractable, without status migrainosus: Secondary | ICD-10-CM

## 2024-03-27 DIAGNOSIS — I10 Essential (primary) hypertension: Secondary | ICD-10-CM

## 2024-03-27 LAB — COMPREHENSIVE METABOLIC PANEL WITH GFR
ALT: 13 U/L (ref 0–35)
AST: 13 U/L (ref 0–37)
Albumin: 4.6 g/dL (ref 3.5–5.2)
Alkaline Phosphatase: 83 U/L (ref 39–117)
BUN: 16 mg/dL (ref 6–23)
CO2: 27 meq/L (ref 19–32)
Calcium: 9.6 mg/dL (ref 8.4–10.5)
Chloride: 104 meq/L (ref 96–112)
Creatinine, Ser: 0.45 mg/dL (ref 0.40–1.20)
GFR: 106.58 mL/min (ref >60.00)
Glucose, Bld: 143 mg/dL — ABNORMAL HIGH (ref 70–99)
Potassium: 4.1 meq/L (ref 3.5–5.1)
Sodium: 140 meq/L (ref 135–145)
Total Bilirubin: 0.7 mg/dL (ref 0.2–1.2)
Total Protein: 7.6 g/dL (ref 6.0–8.3)

## 2024-03-27 LAB — MICROALBUMIN / CREATININE URINE RATIO
Creatinine,U: 76.8 mg/dL
Microalb Creat Ratio: UNDETERMINED mg/g (ref 0.0–30.0)
Microalb, Ur: <0.7 mg/dL

## 2024-03-27 LAB — HEMOGLOBIN A1C: Hgb A1c MFr Bld: 6.8 % — ABNORMAL HIGH (ref 4.6–6.5)

## 2024-03-27 MED ORDER — DAPAGLIFLOZIN PROPANEDIOL 5 MG PO TABS: 5.0000 mg | ORAL_TABLET | Freq: Every day | ORAL | 3 refills | Status: DC

## 2024-03-27 MED ORDER — METFORMIN HCL 500 MG PO TABS: 1000.0000 mg | ORAL_TABLET | Freq: Two times a day (BID) | ORAL | 3 refills | Status: DC

## 2024-03-27 MED ORDER — LISINOPRIL 10 MG PO TABS: 10.0000 mg | ORAL_TABLET | Freq: Every day | ORAL | 3 refills | Status: DC

## 2024-03-27 MED ORDER — HYDROCHLOROTHIAZIDE 25 MG PO TABS: 25.0000 mg | ORAL_TABLET | Freq: Every day | ORAL | 3 refills | Status: DC

## 2024-03-27 MED ORDER — RIZATRIPTAN BENZOATE 10 MG PO TBDP: ORAL_TABLET | ORAL | 2 refills | Status: DC

## 2024-03-27 NOTE — Assessment & Plan Note (Addendum)
 Her diabetes is well-controlled with an A1c of 6.7%, without hypoglycemia or polyuria. Check A1c today. Continue metformin  500 mg two tablets twice daily and Farxiga  5 mg daily. Encourage healthy diet and regular exercise.

## 2024-03-27 NOTE — Assessment & Plan Note (Signed)
 GERD is managed with omeprazole, with no severe symptoms. Mild nausea may be stress-related. Continue omeprazole 20 mg daily. Encourage to monitor diet for triggers.

## 2024-03-27 NOTE — Progress Notes (Signed)
 Bluford Burkitt, NP-C Phone: [phone]  Kathryn Henderson is a 58 y.o. female who presents today for transfer of care.   Discussed the use of AI scribe software for clinical note transcription with the patient, who gave verbal consent to proceed.  History of Present Illness   Kathryn Henderson is a 58 year old female who presents for a transfer of care visit.  She is managing hypertension with lisinopril  and hydrochlorothiazide . She experiences occasional dizziness when standing up quickly and slight leg swelling at the end of the day, which resolves with elevation. She does not monitor her blood pressure at home.  Her diabetes is managed with metformin  and Farxiga . She does not check her blood sugar at home. Her last A1c in November was 6.7%. No excessive thirst or urination beyond her typical activity level.  She experiences migraines and uses Maxalt  (rizatriptan ) as needed. Her migraines have improved with age, and the medication effectively resolves her headaches, with only one instance where it did not. She uses less than nine pills in six months.  She takes omeprazole daily for acid reflux and increases the dose during flare-ups. She recalls past severe episodes mistaken for heart attacks but has not had similar issues since following a specific regimen. No trouble swallowing or blood in her stool. Occasional nausea occurs when her stomach is empty, which she associates with past stress-related ulcers.  She takes Zyrtec regularly and Flonase seasonally for allergies. She reports an increase in non-migraine headaches recently, attributing them to weather changes and pollen, noting that rain helps alleviate symptoms.      Social History   Tobacco Use  Smoking Status Never  Smokeless Tobacco Never    Current Outpatient Medications on File Prior to Visit  Medication Sig Dispense Refill   aspirin-acetaminophen-caffeine (EXCEDRIN MIGRAINE) 250-250-65 MG per tablet Take by mouth every 6  (six) hours as needed for headache.     calcium  carbonate (OSCAL) 1500 (600 Ca) MG TABS tablet Take 1,500 mg by mouth 2 (two) times daily with a meal.     cetirizine (ZYRTEC) 10 MG tablet Take 10 mg by mouth daily.     fluticasone (FLONASE) 50 MCG/ACT nasal spray Place 1 spray into both nostrils daily.     ibuprofen (ADVIL,MOTRIN) 200 MG tablet Take 200 mg by mouth every 6 (six) hours as needed.     Multiple Vitamins-Minerals (MULTIVITAMIN ADULT PO) Take by mouth daily.     omeprazole (PRILOSEC) 20 MG capsule Take 20 mg by mouth as needed.      rosuvastatin  (CRESTOR ) 40 MG tablet TAKE 1 TABLET BY MOUTH DAILY 90 tablet 3   No current facility-administered medications on file prior to visit.     ROS see history of present illness  Objective  Physical Exam Vitals:   03/27/24 0912  BP: 110/78  Pulse: 87  Temp: 98.3 F (36.8 C)  SpO2: 96%    BP Readings from Last 3 Encounters:  03/27/24 110/78  12/06/23 120/70  09/28/23 118/78   Wt Readings from Last 3 Encounters:  03/27/24 189 lb 9.6 oz (86 kg)  12/06/23 184 lb (83.5 kg)  09/28/23 185 lb 6.4 oz (84.1 kg)    Physical Exam Constitutional:      General: She is not in acute distress.    Appearance: Normal appearance.  HENT:     Head: Normocephalic.  Cardiovascular:     Rate and Rhythm: Normal rate and regular rhythm.     Heart sounds: Normal heart sounds.  Pulmonary:     Effort: Pulmonary effort is normal.     Breath sounds: Normal breath sounds.  Skin:    General: Skin is warm and dry.  Neurological:     General: No focal deficit present.     Mental Status: She is alert.  Psychiatric:        Mood and Affect: Mood normal.        Behavior: Behavior normal.      Assessment/Plan: Please see individual problem list.  Type 2 diabetes mellitus without complication, without long-term current use of insulin (HCC) Assessment & Plan: Her diabetes is well-controlled with an A1c of 6.7%, without hypoglycemia or polyuria.  Check A1c today. Continue metformin  500 mg two tablets twice daily and Farxiga  5 mg daily. Encourage healthy diet and regular exercise.   Orders: -     Microalbumin / creatinine urine ratio -     Hemoglobin A1c -     metFORMIN  HCl; Take 2 tablets (1,000 mg total) by mouth 2 (two) times daily with a meal.  Dispense: 360 tablet; Refill: 3 -     Dapagliflozin  Propanediol; Take 1 tablet (5 mg total) by mouth daily before breakfast.  Dispense: 90 tablet; Refill: 3  Essential hypertension Assessment & Plan: Blood pressure is well-controlled at 110/78 mmHg, with occasional orthostatic dizziness. She will continue to monitor. Continue lisinopril  10 mg daily and hydrochlorothiazide  25 mg daily. Check CMP today.   Orders: -     Microalbumin / creatinine urine ratio -     hydroCHLOROthiazide ; Take 1 tablet (25 mg total) by mouth daily.  Dispense: 90 tablet; Refill: 3 -     Lisinopril ; Take 1 tablet (10 mg total) by mouth daily.  Dispense: 90 tablet; Refill: 3 -     Comprehensive metabolic panel with GFR  Migraine with aura and without status migrainosus, not intractable Assessment & Plan: Migraines are infrequent and well-managed with rizatriptan , with noted improvement. Refill rizatriptan  prescription.   Orders: -     Rizatriptan  Benzoate; TAKE 1 TABLET DISSOLVED ON TONGUE AS NEEDED FOR MIRGAINE MAY REPEAT IN 2 HOURS IF NEEDED NO MORE THAN 2 IN 24 HOURS  Dispense: 10 tablet; Refill: 2  Hyperlipidemia, unspecified hyperlipidemia type Assessment & Plan: Cholesterol is managed with Crestor  with no side effects reported. Continue Crestor  40 mg daily. LDL at goal.    Gastroesophageal reflux disease without esophagitis Assessment & Plan: GERD is managed with omeprazole, with no severe symptoms. Mild nausea may be stress-related. Continue omeprazole 20 mg daily. Encourage to monitor diet for triggers.       Return in about 6 months (around 09/27/2024) for Follow up.   Bluford Burkitt,  NP-C [facility]

## 2024-03-27 NOTE — Assessment & Plan Note (Signed)
 Migraines are infrequent and well-managed with rizatriptan , with noted improvement. Refill rizatriptan  prescription.

## 2024-03-27 NOTE — Assessment & Plan Note (Addendum)
 Cholesterol is managed with Crestor  with no side effects reported. Continue Crestor  40 mg daily. LDL at goal.

## 2024-03-27 NOTE — Assessment & Plan Note (Signed)
 Blood pressure is well-controlled at 110/78 mmHg, with occasional orthostatic dizziness. She will continue to monitor. Continue lisinopril  10 mg daily and hydrochlorothiazide  25 mg daily. Check CMP today.

## 2024-05-16 DIAGNOSIS — R051 Acute cough: Secondary | ICD-10-CM | POA: Diagnosis not present

## 2024-05-16 DIAGNOSIS — Z03818 Encounter for observation for suspected exposure to other biological agents ruled out: Secondary | ICD-10-CM | POA: Diagnosis not present

## 2024-05-16 DIAGNOSIS — J019 Acute sinusitis, unspecified: Secondary | ICD-10-CM | POA: Diagnosis not present

## 2024-05-16 DIAGNOSIS — J029 Acute pharyngitis, unspecified: Secondary | ICD-10-CM | POA: Diagnosis not present

## 2024-08-02 DIAGNOSIS — M9903 Segmental and somatic dysfunction of lumbar region: Secondary | ICD-10-CM | POA: Diagnosis not present

## 2024-08-02 DIAGNOSIS — M5441 Lumbago with sciatica, right side: Secondary | ICD-10-CM | POA: Diagnosis not present

## 2024-08-02 DIAGNOSIS — M9904 Segmental and somatic dysfunction of sacral region: Secondary | ICD-10-CM | POA: Diagnosis not present

## 2024-08-02 DIAGNOSIS — M9902 Segmental and somatic dysfunction of thoracic region: Secondary | ICD-10-CM | POA: Diagnosis not present

## 2024-08-02 DIAGNOSIS — M5386 Other specified dorsopathies, lumbar region: Secondary | ICD-10-CM | POA: Diagnosis not present

## 2024-08-02 DIAGNOSIS — M51362 Other intervertebral disc degeneration, lumbar region with discogenic back pain and lower extremity pain: Secondary | ICD-10-CM | POA: Diagnosis not present

## 2024-08-02 DIAGNOSIS — M9905 Segmental and somatic dysfunction of pelvic region: Secondary | ICD-10-CM | POA: Diagnosis not present

## 2024-08-06 DIAGNOSIS — M51362 Other intervertebral disc degeneration, lumbar region with discogenic back pain and lower extremity pain: Secondary | ICD-10-CM | POA: Diagnosis not present

## 2024-08-06 DIAGNOSIS — M9902 Segmental and somatic dysfunction of thoracic region: Secondary | ICD-10-CM | POA: Diagnosis not present

## 2024-08-06 DIAGNOSIS — M9904 Segmental and somatic dysfunction of sacral region: Secondary | ICD-10-CM | POA: Diagnosis not present

## 2024-08-06 DIAGNOSIS — M5441 Lumbago with sciatica, right side: Secondary | ICD-10-CM | POA: Diagnosis not present

## 2024-08-06 DIAGNOSIS — M9903 Segmental and somatic dysfunction of lumbar region: Secondary | ICD-10-CM | POA: Diagnosis not present

## 2024-08-06 DIAGNOSIS — M9905 Segmental and somatic dysfunction of pelvic region: Secondary | ICD-10-CM | POA: Diagnosis not present

## 2024-08-06 DIAGNOSIS — M5386 Other specified dorsopathies, lumbar region: Secondary | ICD-10-CM | POA: Diagnosis not present

## 2024-08-09 DIAGNOSIS — M9903 Segmental and somatic dysfunction of lumbar region: Secondary | ICD-10-CM | POA: Diagnosis not present

## 2024-08-09 DIAGNOSIS — M9905 Segmental and somatic dysfunction of pelvic region: Secondary | ICD-10-CM | POA: Diagnosis not present

## 2024-08-09 DIAGNOSIS — M51362 Other intervertebral disc degeneration, lumbar region with discogenic back pain and lower extremity pain: Secondary | ICD-10-CM | POA: Diagnosis not present

## 2024-08-09 DIAGNOSIS — M9904 Segmental and somatic dysfunction of sacral region: Secondary | ICD-10-CM | POA: Diagnosis not present

## 2024-08-09 DIAGNOSIS — M5441 Lumbago with sciatica, right side: Secondary | ICD-10-CM | POA: Diagnosis not present

## 2024-08-09 DIAGNOSIS — M9902 Segmental and somatic dysfunction of thoracic region: Secondary | ICD-10-CM | POA: Diagnosis not present

## 2024-08-09 DIAGNOSIS — M5386 Other specified dorsopathies, lumbar region: Secondary | ICD-10-CM | POA: Diagnosis not present

## 2024-08-13 DIAGNOSIS — M9902 Segmental and somatic dysfunction of thoracic region: Secondary | ICD-10-CM | POA: Diagnosis not present

## 2024-08-13 DIAGNOSIS — M9904 Segmental and somatic dysfunction of sacral region: Secondary | ICD-10-CM | POA: Diagnosis not present

## 2024-08-13 DIAGNOSIS — M9903 Segmental and somatic dysfunction of lumbar region: Secondary | ICD-10-CM | POA: Diagnosis not present

## 2024-08-13 DIAGNOSIS — M51362 Other intervertebral disc degeneration, lumbar region with discogenic back pain and lower extremity pain: Secondary | ICD-10-CM | POA: Diagnosis not present

## 2024-08-13 DIAGNOSIS — M5441 Lumbago with sciatica, right side: Secondary | ICD-10-CM | POA: Diagnosis not present

## 2024-08-13 DIAGNOSIS — M9905 Segmental and somatic dysfunction of pelvic region: Secondary | ICD-10-CM | POA: Diagnosis not present

## 2024-08-13 DIAGNOSIS — M5386 Other specified dorsopathies, lumbar region: Secondary | ICD-10-CM | POA: Diagnosis not present

## 2024-08-15 DIAGNOSIS — M9902 Segmental and somatic dysfunction of thoracic region: Secondary | ICD-10-CM | POA: Diagnosis not present

## 2024-08-15 DIAGNOSIS — M51362 Other intervertebral disc degeneration, lumbar region with discogenic back pain and lower extremity pain: Secondary | ICD-10-CM | POA: Diagnosis not present

## 2024-08-15 DIAGNOSIS — M5441 Lumbago with sciatica, right side: Secondary | ICD-10-CM | POA: Diagnosis not present

## 2024-08-15 DIAGNOSIS — M5386 Other specified dorsopathies, lumbar region: Secondary | ICD-10-CM | POA: Diagnosis not present

## 2024-08-15 DIAGNOSIS — M9905 Segmental and somatic dysfunction of pelvic region: Secondary | ICD-10-CM | POA: Diagnosis not present

## 2024-08-15 DIAGNOSIS — M9903 Segmental and somatic dysfunction of lumbar region: Secondary | ICD-10-CM | POA: Diagnosis not present

## 2024-08-15 DIAGNOSIS — M9904 Segmental and somatic dysfunction of sacral region: Secondary | ICD-10-CM | POA: Diagnosis not present

## 2024-08-20 DIAGNOSIS — M9902 Segmental and somatic dysfunction of thoracic region: Secondary | ICD-10-CM | POA: Diagnosis not present

## 2024-08-20 DIAGNOSIS — M9904 Segmental and somatic dysfunction of sacral region: Secondary | ICD-10-CM | POA: Diagnosis not present

## 2024-08-20 DIAGNOSIS — M9903 Segmental and somatic dysfunction of lumbar region: Secondary | ICD-10-CM | POA: Diagnosis not present

## 2024-08-20 DIAGNOSIS — M9905 Segmental and somatic dysfunction of pelvic region: Secondary | ICD-10-CM | POA: Diagnosis not present

## 2024-08-22 DIAGNOSIS — M5386 Other specified dorsopathies, lumbar region: Secondary | ICD-10-CM | POA: Diagnosis not present

## 2024-08-22 DIAGNOSIS — M5441 Lumbago with sciatica, right side: Secondary | ICD-10-CM | POA: Diagnosis not present

## 2024-08-22 DIAGNOSIS — M9903 Segmental and somatic dysfunction of lumbar region: Secondary | ICD-10-CM | POA: Diagnosis not present

## 2024-08-29 DIAGNOSIS — M9905 Segmental and somatic dysfunction of pelvic region: Secondary | ICD-10-CM | POA: Diagnosis not present

## 2024-08-29 DIAGNOSIS — M9903 Segmental and somatic dysfunction of lumbar region: Secondary | ICD-10-CM | POA: Diagnosis not present

## 2024-08-29 DIAGNOSIS — M9902 Segmental and somatic dysfunction of thoracic region: Secondary | ICD-10-CM | POA: Diagnosis not present

## 2024-08-29 DIAGNOSIS — M5386 Other specified dorsopathies, lumbar region: Secondary | ICD-10-CM | POA: Diagnosis not present

## 2024-08-29 DIAGNOSIS — M5441 Lumbago with sciatica, right side: Secondary | ICD-10-CM | POA: Diagnosis not present

## 2024-09-07 DIAGNOSIS — J019 Acute sinusitis, unspecified: Secondary | ICD-10-CM | POA: Diagnosis not present

## 2024-09-07 DIAGNOSIS — J209 Acute bronchitis, unspecified: Secondary | ICD-10-CM | POA: Diagnosis not present

## 2024-09-07 DIAGNOSIS — J029 Acute pharyngitis, unspecified: Secondary | ICD-10-CM | POA: Diagnosis not present

## 2024-09-07 DIAGNOSIS — Z03818 Encounter for observation for suspected exposure to other biological agents ruled out: Secondary | ICD-10-CM | POA: Diagnosis not present

## 2024-09-13 NOTE — Progress Notes (Signed)
 Kathryn Henderson                                          MRN: [id number]   09/13/2024   The [facility] Quality Team Specialist reviewed this patient medical record for the purposes of chart review for care gap closure. The following were reviewed: abstraction for care gap closure-kidney health evaluation for diabetes:eGFR  and uACR.    [facility] Quality Team

## 2024-09-20 DIAGNOSIS — M9903 Segmental and somatic dysfunction of lumbar region: Secondary | ICD-10-CM | POA: Diagnosis not present

## 2024-09-20 DIAGNOSIS — M9905 Segmental and somatic dysfunction of pelvic region: Secondary | ICD-10-CM | POA: Diagnosis not present

## 2024-09-20 DIAGNOSIS — M9904 Segmental and somatic dysfunction of sacral region: Secondary | ICD-10-CM | POA: Diagnosis not present

## 2024-09-20 DIAGNOSIS — M9902 Segmental and somatic dysfunction of thoracic region: Secondary | ICD-10-CM | POA: Diagnosis not present

## 2024-10-03 ENCOUNTER — Ambulatory Visit

## 2024-10-03 ENCOUNTER — Other Ambulatory Visit (HOSPITAL_COMMUNITY): Payer: Self-pay

## 2024-10-03 ENCOUNTER — Ambulatory Visit: Admitting: Nurse Practitioner

## 2024-10-03 ENCOUNTER — Telehealth: Payer: Self-pay

## 2024-10-03 ENCOUNTER — Encounter: Payer: Self-pay | Admitting: Nurse Practitioner

## 2024-10-03 ENCOUNTER — Ambulatory Visit: Payer: Self-pay | Admitting: Nurse Practitioner

## 2024-10-03 VITALS — BP 128/84 | HR 92 | Temp 98.5°F | Ht 63.75 in | Wt 186.8 lb

## 2024-10-03 DIAGNOSIS — R059 Cough, unspecified: Secondary | ICD-10-CM | POA: Diagnosis not present

## 2024-10-03 DIAGNOSIS — J40 Bronchitis, not specified as acute or chronic: Secondary | ICD-10-CM | POA: Diagnosis not present

## 2024-10-03 DIAGNOSIS — E785 Hyperlipidemia, unspecified: Secondary | ICD-10-CM | POA: Diagnosis not present

## 2024-10-03 DIAGNOSIS — Z7984 Long term (current) use of oral hypoglycemic drugs: Secondary | ICD-10-CM

## 2024-10-03 DIAGNOSIS — E119 Type 2 diabetes mellitus without complications: Secondary | ICD-10-CM

## 2024-10-03 DIAGNOSIS — I10 Essential (primary) hypertension: Secondary | ICD-10-CM

## 2024-10-03 DIAGNOSIS — G43109 Migraine with aura, not intractable, without status migrainosus: Secondary | ICD-10-CM

## 2024-10-03 LAB — LIPID PANEL
Cholesterol: 114 mg/dL (ref 0–200)
HDL: 44.1 mg/dL (ref >39.00)
LDL Cholesterol: 43 mg/dL (ref 0–99)
NonHDL: 70.2
Total CHOL/HDL Ratio: 3
Triglycerides: 138 mg/dL (ref 0.0–149.0)
VLDL: 27.6 mg/dL (ref 0.0–40.0)

## 2024-10-03 LAB — COMPREHENSIVE METABOLIC PANEL WITH GFR
ALT: 17 U/L (ref 0–35)
AST: 19 U/L (ref 0–37)
Albumin: 4.3 g/dL (ref 3.5–5.2)
Alkaline Phosphatase: 79 U/L (ref 39–117)
BUN: 12 mg/dL (ref 6–23)
CO2: 28 meq/L (ref 19–32)
Calcium: 9.6 mg/dL (ref 8.4–10.5)
Chloride: 102 meq/L (ref 96–112)
Creatinine, Ser: 0.49 mg/dL (ref 0.40–1.20)
GFR: 104.03 mL/min (ref >60.00)
Glucose, Bld: 183 mg/dL — ABNORMAL HIGH (ref 70–99)
Potassium: 3.8 meq/L (ref 3.5–5.1)
Sodium: 139 meq/L (ref 135–145)
Total Bilirubin: 1 mg/dL (ref 0.2–1.2)
Total Protein: 7 g/dL (ref 6.0–8.3)

## 2024-10-03 LAB — HEMOGLOBIN A1C: Hgb A1c MFr Bld: 6.8 % — ABNORMAL HIGH (ref 4.6–6.5)

## 2024-10-03 MED ORDER — LISINOPRIL 10 MG PO TABS: 10.0000 mg | ORAL_TABLET | Freq: Every day | ORAL | 3 refills | Status: AC

## 2024-10-03 MED ORDER — ROSUVASTATIN CALCIUM 40 MG PO TABS: 40.0000 mg | ORAL_TABLET | Freq: Every day | ORAL | 3 refills | Status: AC

## 2024-10-03 MED ORDER — RIZATRIPTAN BENZOATE 10 MG PO TBDP: ORAL_TABLET | ORAL | 2 refills | Status: AC

## 2024-10-03 MED ORDER — HYDROCHLOROTHIAZIDE 25 MG PO TABS: 25.0000 mg | ORAL_TABLET | Freq: Every day | ORAL | 3 refills | Status: AC

## 2024-10-03 MED ORDER — AZITHROMYCIN 250 MG PO TABS: ORAL_TABLET | ORAL | 0 refills | Status: AC

## 2024-10-03 MED ORDER — METFORMIN HCL 500 MG PO TABS: 1000.0000 mg | ORAL_TABLET | Freq: Two times a day (BID) | ORAL | 3 refills | Status: AC

## 2024-10-03 MED ORDER — METHYLPREDNISOLONE 4 MG PO TBPK: ORAL_TABLET | ORAL | 0 refills | Status: AC

## 2024-10-03 MED ORDER — DAPAGLIFLOZIN PROPANEDIOL 5 MG PO TABS: 5.0000 mg | ORAL_TABLET | Freq: Every day | ORAL | 3 refills | Status: AC

## 2024-10-03 MED ORDER — BENZONATATE 200 MG PO CAPS: 200.0000 mg | ORAL_CAPSULE | Freq: Three times a day (TID) | ORAL | 0 refills | Status: AC | PRN

## 2024-10-03 NOTE — Telephone Encounter (Signed)
 Pharmacy Patient Advocate Encounter   Received notification from Onbase that prior authorization for rizatriptan  (MAXALT -MLT) 10 MG disintegrating tablet  is required/requested.   Insurance verification completed.   The patient is insured through [employer] .   Per test claim: PA required; PA submitted to above mentioned insurance via Latent Key/confirmation #/EOC [id number] Status is pending

## 2024-10-03 NOTE — Progress Notes (Unsigned)
 Leron Glance, NP-C Phone: [phone]  Kathryn Henderson is a 58 y.o. female who presents today for follow up.   ***  Social History   Tobacco Use  Smoking Status Never  Smokeless Tobacco Never    Current Outpatient Medications on File Prior to Visit  Medication Sig Dispense Refill   aspirin-acetaminophen-caffeine (EXCEDRIN MIGRAINE) 250-250-65 MG per tablet Take by mouth every 6 (six) hours as needed for headache.     calcium  carbonate (OSCAL) 1500 (600 Ca) MG TABS tablet Take 1,500 mg by mouth 2 (two) times daily with a meal.     cetirizine (ZYRTEC) 10 MG tablet Take 10 mg by mouth daily.     fluticasone (FLONASE) 50 MCG/ACT nasal spray Place 1 spray into both nostrils daily.     ibuprofen (ADVIL,MOTRIN) 200 MG tablet Take 200 mg by mouth every 6 (six) hours as needed.     Multiple Vitamins-Minerals (MULTIVITAMIN ADULT PO) Take by mouth daily.     omeprazole (PRILOSEC) 20 MG capsule Take 20 mg by mouth as needed.      No current facility-administered medications on file prior to visit.     ROS see history of present illness  Objective  Physical Exam Vitals:   10/03/24 0820  BP: 128/84  Pulse: 92  Temp: 98.5 F (36.9 C)  SpO2: 97%    BP Readings from Last 3 Encounters:  10/03/24 128/84  03/27/24 110/78  12/06/23 120/70   Wt Readings from Last 3 Encounters:  10/03/24 186 lb 12.8 oz (84.7 kg)  03/27/24 189 lb 9.6 oz (86 kg)  12/06/23 184 lb (83.5 kg)    Physical Exam Constitutional:      General: She is not in acute distress.    Appearance: Normal appearance.  HENT:     Head: Normocephalic.  Cardiovascular:     Rate and Rhythm: Normal rate and regular rhythm.     Heart sounds: Normal heart sounds.  Pulmonary:     Effort: Pulmonary effort is normal.     Breath sounds: Normal breath sounds.  Skin:    General: Skin is warm and dry.  Neurological:     General: No focal deficit present.     Mental Status: She is alert.  Psychiatric:        Mood and  Affect: Mood normal.        Behavior: Behavior normal.      Assessment/Plan: Please see individual problem list.  Bronchitis -     DG Chest 2 View; Future -     Azithromycin ; Take 2 tablets on day 1, then 1 tablet daily on days 2 through 5  Dispense: 6 tablet; Refill: 0 -     methylPREDNISolone ; Take as directed.  Dispense: 21 each; Refill: 0 -     Benzonatate ; Take 1 capsule (200 mg total) by mouth 3 (three) times daily as needed for cough.  Dispense: 30 capsule; Refill: 0  Type 2 diabetes mellitus without complication, without long-term current use of insulin (HCC) -     Dapagliflozin  Propanediol; Take 1 tablet (5 mg total) by mouth daily before breakfast.  Dispense: 90 tablet; Refill: 3 -     metFORMIN  HCl; Take 2 tablets (1,000 mg total) by mouth 2 (two) times daily with a meal.  Dispense: 360 tablet; Refill: 3 -     Hemoglobin A1c  Essential hypertension -     hydroCHLOROthiazide ; Take 1 tablet (25 mg total) by mouth daily.  Dispense: 90 tablet; Refill: 3 -  Lisinopril ; Take 1 tablet (10 mg total) by mouth daily.  Dispense: 90 tablet; Refill: 3 -     Comprehensive metabolic panel with GFR  Hyperlipidemia, unspecified hyperlipidemia type -     Rosuvastatin  Calcium ; Take 1 tablet (40 mg total) by mouth daily.  Dispense: 90 tablet; Refill: 3 -     Lipid panel  Migraine with aura and without status migrainosus, not intractable -     Rizatriptan  Benzoate; TAKE 1 TABLET DISSOLVED ON TONGUE AS NEEDED FOR MIRGAINE MAY REPEAT IN 2 HOURS IF NEEDED NO MORE THAN 2 IN 24 HOURS  Dispense: 10 tablet; Refill: 2     Health Maintenance: ***  Return in about 6 months (around 04/03/2025), or if symptoms worsen or fail to improve, for Follow up.   Leron Glance, NP-C [facility]

## 2024-10-04 NOTE — Telephone Encounter (Signed)
 Pharmacy Patient Advocate Encounter  Received notification from [employer]  that Prior Authorization for Rizatriptan  Benzoate 10MG  dispersible tablets  has been APPROVED from 10/04/1924 to 10/04/2025   PA #/Case ID/Reference #: [id number]  PA Case: [id number], Status: Approved, Coverage Starts on: 10/04/2024 12:00:00 AM, Coverage Ends on: 10/04/2025 12:00:00 AM. Authorization Expiration12/01/2025

## 2024-10-23 ENCOUNTER — Encounter: Payer: Self-pay | Admitting: Nurse Practitioner

## 2024-10-23 NOTE — Assessment & Plan Note (Signed)
 Her A1c was previously at 6.8, indicating good control, but may increase due to recent prednisone use. Check A1c level today to assess current control. Continue metformin  and Farxiga  for diabetes management.

## 2024-10-23 NOTE — Assessment & Plan Note (Signed)
 Blood pressure is managed with lisinopril  and hydrochlorothiazide , with no recent home monitoring. Continue lisinopril  and hydrochlorothiazide  for blood pressure control.

## 2024-10-23 NOTE — Assessment & Plan Note (Signed)
 She has a persistent cough with congestion and post-nasal drip, considering bronchitis and possible pneumonia. A chest x-ray is ordered to rule out pneumonia or other complications. Start Z-Pak (azithromycin ) for antibiotic coverage and Medrol  Dosepak (methylprednisolone ). Prescribe Tessalon  Perles for cough management. Advise staying hydrated and continuing over-the-counter decongestants and Mucinex as needed. Return precautions given to patient.

## 2024-10-23 NOTE — Assessment & Plan Note (Signed)
 Cholesterol is managed with Crestor  with no side effects reported. Continue Crestor  40 mg daily. LDL at goal. Check lipid panel.

## 2025-04-03 ENCOUNTER — Ambulatory Visit: Admitting: Nurse Practitioner
